# Patient Record
Sex: Male | Born: 1935 | Race: White | Hispanic: No | Marital: Married | Smoking: Former smoker
Health system: Southern US, Community
[De-identification: ages and names within clinical notes are randomized; demographics above are authoritative.]

## PROBLEM LIST (undated history)

## (undated) DIAGNOSIS — C189 Malignant neoplasm of colon, unspecified: Secondary | ICD-10-CM

## (undated) DIAGNOSIS — N390 Urinary tract infection, site not specified: Secondary | ICD-10-CM

## (undated) DIAGNOSIS — H9190 Unspecified hearing loss, unspecified ear: Secondary | ICD-10-CM

## (undated) HISTORY — PX: COLON SURGERY: SHX602

## (undated) HISTORY — PX: HAND SURGERY: SHX662

---

## 2011-06-08 DIAGNOSIS — C189 Malignant neoplasm of colon, unspecified: Secondary | ICD-10-CM

## 2011-06-08 HISTORY — DX: Malignant neoplasm of colon, unspecified: C18.9

## 2016-01-16 ENCOUNTER — Emergency Department: Admit: 2016-01-16 | Payer: MEDICARE | Primary: Family Medicine

## 2016-01-16 ENCOUNTER — Inpatient Hospital Stay
Admit: 2016-01-16 | Discharge: 2016-01-20 | Disposition: A | Discharge status: Home / Self Care | Payer: MEDICARE | Attending: Internal Medicine | Admitting: Internal Medicine

## 2016-01-16 DIAGNOSIS — A419 Sepsis, unspecified organism: Secondary | ICD-10-CM

## 2016-01-16 LAB — CBC WITH AUTOMATED DIFF
ABS. BASOPHILS: 0 10*3/uL (ref 0.0–0.1)
ABS. EOSINOPHILS: 0 10*3/uL (ref 0.0–0.4)
ABS. LYMPHOCYTES: 0.3 10*3/uL — ABNORMAL LOW (ref 0.8–3.5)
ABS. MONOCYTES: 0.9 10*3/uL (ref 0.0–1.0)
ABS. NEUTROPHILS: 13.3 10*3/uL — ABNORMAL HIGH (ref 1.8–8.0)
BASOPHILS: 0 % (ref 0–1)
EOSINOPHILS: 0 % (ref 0–7)
HCT: 39.7 % (ref 36.6–50.3)
HGB: 13.6 g/dL (ref 12.1–17.0)
LYMPHOCYTES: 2 % — ABNORMAL LOW (ref 12–49)
MCH: 32 PG (ref 26.0–34.0)
MCHC: 34.3 g/dL (ref 30.0–36.5)
MCV: 93.4 FL (ref 80.0–99.0)
MONOCYTES: 6 % (ref 5–13)
NEUTROPHILS: 92 % — ABNORMAL HIGH (ref 32–75)
PLATELET: 252 10*3/uL (ref 150–400)
RBC: 4.25 M/uL (ref 4.10–5.70)
RDW: 14.8 % — ABNORMAL HIGH (ref 11.5–14.5)
WBC: 14.5 10*3/uL — ABNORMAL HIGH (ref 4.1–11.1)

## 2016-01-16 LAB — URINALYSIS W/ REFLEX CULTURE
Bacteria: NEGATIVE /hpf
Bilirubin: NEGATIVE
Glucose: NEGATIVE mg/dL
Ketone: 15 mg/dL — AB
Nitrites: NEGATIVE
Protein: NEGATIVE mg/dL
Specific gravity: 1.017 (ref 1.003–1.030)
Urobilinogen: 1 EU/dL (ref 0.2–1.0)
pH (UA): 7 (ref 5.0–8.0)

## 2016-01-16 LAB — METABOLIC PANEL, COMPREHENSIVE
A-G Ratio: 0.9 — ABNORMAL LOW (ref 1.1–2.2)
ALT (SGPT): 13 U/L (ref 12–78)
AST (SGOT): 18 U/L (ref 15–37)
Albumin: 3.3 g/dL — ABNORMAL LOW (ref 3.5–5.0)
Alk. phosphatase: 55 U/L (ref 45–117)
Anion gap: 9 mmol/L (ref 5–15)
BUN/Creatinine ratio: 14 (ref 12–20)
BUN: 13 MG/DL (ref 6–20)
Bilirubin, total: 1.4 MG/DL — ABNORMAL HIGH (ref 0.2–1.0)
CO2: 25 mmol/L (ref 21–32)
Calcium: 9 MG/DL (ref 8.5–10.1)
Chloride: 102 mmol/L (ref 97–108)
Creatinine: 0.93 MG/DL (ref 0.70–1.30)
GFR est AA: >60 mL/min/{1.73_m2} (ref >60)
GFR est non-AA: >60 mL/min/{1.73_m2} (ref >60)
Globulin: 3.7 g/dL (ref 2.0–4.0)
Glucose: 100 mg/dL (ref 65–100)
Potassium: 4.2 mmol/L (ref 3.5–5.1)
Protein, total: 7 g/dL (ref 6.4–8.2)
Sodium: 136 mmol/L (ref 136–145)

## 2016-01-16 LAB — EKG, 12 LEAD, INITIAL
Atrial Rate: 102 {beats}/min
Calculated P Axis: 56 degrees
Calculated R Axis: 52 degrees
Calculated T Axis: 28 degrees
P-R Interval: 196 ms
Q-T Interval: 324 ms
QRS Duration: 70 ms
QTC Calculation (Bezet): 422 ms
Ventricular Rate: 102 {beats}/min

## 2016-01-16 LAB — LACTIC ACID: Lactic acid: 1.3 MMOL/L (ref 0.4–2.0)

## 2016-01-16 MED ORDER — SODIUM CHLORIDE 0.9 % IJ SYRG
INTRAMUSCULAR | Status: DC | PRN
Start: 2016-01-16 — End: 2016-01-20

## 2016-01-16 MED ORDER — ENOXAPARIN 40 MG/0.4 ML SUB-Q SYRINGE
40 mg/0.4 mL | SUBCUTANEOUS | Status: DC
Start: 2016-01-16 — End: 2016-01-20
  Administered 2016-01-17 – 2016-01-20 (×4): via SUBCUTANEOUS

## 2016-01-16 MED ORDER — ACETAMINOPHEN 325 MG TABLET
325 mg | ORAL | Status: DC | PRN
Start: 2016-01-16 — End: 2016-01-20
  Administered 2016-01-16 – 2016-01-19 (×3): via ORAL

## 2016-01-16 MED ORDER — ONDANSETRON (PF) 4 MG/2 ML INJECTION
4 mg/2 mL | INTRAMUSCULAR | Status: DC | PRN
Start: 2016-01-16 — End: 2016-01-20

## 2016-01-16 MED ORDER — SODIUM CHLORIDE 0.9% BOLUS IV
0.9 % | Freq: Once | INTRAVENOUS | Status: AC
Start: 2016-01-16 — End: 2016-01-16
  Administered 2016-01-16: 17:00:00 via INTRAVENOUS

## 2016-01-16 MED ORDER — SODIUM CHLORIDE 0.9 % IV
INTRAVENOUS | Status: DC
Start: 2016-01-16 — End: 2016-01-19
  Administered 2016-01-16 – 2016-01-17 (×3): via INTRAVENOUS

## 2016-01-16 MED ORDER — LEVOFLOXACIN IN D5W 750 MG/150 ML IV PIGGY BACK
750 mg/150 mL | INTRAVENOUS | Status: AC
Start: 2016-01-16 — End: 2016-01-16
  Administered 2016-01-16: 19:00:00 via INTRAVENOUS

## 2016-01-16 MED ORDER — SODIUM CHLORIDE 0.9 % IV PIGGY BACK
1 gram | INTRAVENOUS | Status: DC
Start: 2016-01-16 — End: 2016-01-18
  Administered 2016-01-16 – 2016-01-17 (×2): via INTRAVENOUS

## 2016-01-16 MED ORDER — SODIUM CHLORIDE 0.9 % IJ SYRG
Freq: Three times a day (TID) | INTRAMUSCULAR | Status: DC
Start: 2016-01-16 — End: 2016-01-20
  Administered 2016-01-16 – 2016-01-20 (×12): via INTRAVENOUS

## 2016-01-16 MED ORDER — PIPERACILLIN-TAZOBACTAM 3.375 GRAM IV SOLR
3.375 gram | INTRAVENOUS | Status: AC
Start: 2016-01-16 — End: 2016-01-16
  Administered 2016-01-16: 17:00:00 via INTRAVENOUS

## 2016-01-16 MED FILL — ACETAMINOPHEN 325 MG TABLET: 325 mg | ORAL | Qty: 2

## 2016-01-16 MED FILL — ZOSYN 3.375 GRAM INTRAVENOUS SOLUTION: 3.375 gram | INTRAVENOUS | Qty: 3.38

## 2016-01-16 MED FILL — SODIUM CHLORIDE 0.9 % IV: INTRAVENOUS | Qty: 2500

## 2016-01-16 MED FILL — LEVOFLOXACIN IN D5W 750 MG/150 ML IV PIGGY BACK: 750 mg/150 mL | INTRAVENOUS | Qty: 150

## 2016-01-16 MED FILL — SODIUM CHLORIDE 0.9 % IV: INTRAVENOUS | Qty: 1000

## 2016-01-16 MED FILL — CEFTRIAXONE 1 GRAM SOLUTION FOR INJECTION: 1 gram | INTRAMUSCULAR | Qty: 1

## 2016-01-16 MED FILL — BD POSIFLUSH NORMAL SALINE 0.9 % INJECTION SYRINGE: INTRAMUSCULAR | Qty: 10

## 2016-01-16 NOTE — ED Provider Notes (Signed)
HPI Comments: 82 y.o. male with past medical history significant for colon cancer and s/p colostomy who presents from home with chief complaint of fever. Pt's daughter reports that pt woke up this morning with shaking chills, at which point she took his temperature and got 100 F. She tried to get him to eat and drink water but he refused, and she later took his temperature again and it had raised to 102 F. Pt's daughter reports that he was able to void apparently clear urine, but his output seemed decreased. Pt denies any pain, but reports feeling nauseous and lacking an appetite. Pt had the urge to urinate earlier, but was unable to. Pt has no hx of UTI or pneumonia. Daughter notes that the pt has some level of abdominal distention at baseline and this is not acutely changed. 10 days ago, patient had hand surgery and was on Abx until 3 days ago. Pt had a follow up appointment yesterday and surgical wound was cleansed and the doctor said it was fine. Pt was complaint free and at baseline yesterday according to family. Associated sx include cough. There are no other acute medical concerns at this time.    Note written by Delman Cheadle, Scribe, as dictated by Christella Noa, MD 1:16 PM          The history is provided by the patient and a relative (daughter). No language interpreter was used.        No past medical history on file.    No past surgical history on file.      No family history on file.    Social History     Social History   ??? Marital status: N/A     Spouse name: N/A   ??? Number of children: N/A   ??? Years of education: N/A     Occupational History   ??? Not on file.     Social History Main Topics   ??? Smoking status: Not on file   ??? Smokeless tobacco: Not on file   ??? Alcohol use Not on file   ??? Drug use: Not on file   ??? Sexual activity: Not on file     Other Topics Concern   ??? Not on file     Social History Narrative         ALLERGIES: Review of patient's allergies indicates not on file.     Review of Systems   Constitutional: Positive for appetite change, chills and fever.   Respiratory: Positive for cough. Negative for shortness of breath.    Cardiovascular: Negative for chest pain and leg swelling.   Gastrointestinal: Positive for abdominal distention and nausea. Negative for abdominal pain and constipation.   Genitourinary: Positive for decreased urine volume and difficulty urinating. Negative for dysuria.   Skin: Negative for rash.   All other systems reviewed and are negative.      Vitals:    01/16/16 1259   BP: 143/89   Pulse: (!) 104   Resp: 18   Temp: (!) 100.5 ??F (38.1 ??C)   SpO2: 98%   Weight: 81.6 kg (180 lb)   Height:  (1.753 m)            Physical Exam   Constitutional: He is oriented to person, place, and time. He appears well-developed and well-nourished. No distress.   Elderly, but alert and oriented x4.  Currently no shaking chills. Febrile to 105 F.    HENT:   Head:  Normocephalic and atraumatic.   Eyes: Conjunctivae are normal. No scleral icterus.   Neck: Neck supple. No tracheal deviation present.   Cardiovascular: Normal rate, regular rhythm, normal heart sounds and intact distal pulses.  Exam reveals no gallop and no friction rub.    No murmur heard.  Pulmonary/Chest: Effort normal and breath sounds normal. He has no wheezes. He has no rales.   Abdominal: Soft. He exhibits distension. There is no tenderness. There is no rebound and no guarding.   Colostomy. Abdomen distended but non-tender.   Musculoskeletal: He exhibits no edema.   Surgical dressing on right hand wound.    Neurological: He is alert and oriented to person, place, and time.   Skin: Skin is warm and dry. No rash noted.   Psychiatric: He has a normal mood and affect.   Nursing note and vitals reviewed.  Note written by Delman CheadleAlexander P. Martin, Scribe, as dictated by Christella Noaharles D Yolanda Huffstetler, MD 1:34 PM      MDM  Number of Diagnoses or Management Options  Sepsis, due to unspecified organism [facility](HCC):    Urinary tract infection without hematuria, site unspecified:      Amount and/or Complexity of Data Reviewed  Clinical lab tests: ordered and reviewed  Tests in the radiology section of CPT??: ordered and reviewed  Tests in the medicine section of CPT??: ordered and reviewed  Discussion of test results with the performing providers: yes  Obtain history from someone other than the patient: yes  Discuss the patient with other providers: yes      ED Course       Procedures      ED EKG interpretation:  Rhythm: sinus tachycardia; and regular . Rate (approx.): 102; Axis: normal; ST/T wave: normal.  Note written by Delman CheadleAlexander P. Martin, Scribe, as dictated by Christella Noaharles D Tyqwan Pink, MD 1:35 PM    PROGRESS NOTE:  1:57 PM  Pt has early sepsis, probably UTI source. Will admit to hospitalist service for further evaluation.      CONSULT NOTE:  2:26 PM Christella Noaharles D Dandre Sisler, MD spoke with Dr. Selena BattenKim, Consult for Hospitalist.  Discussed available diagnostic tests and clinical findings. Dr. Selena BattenKim will admit the pt.

## 2016-01-16 NOTE — ED Triage Notes (Signed)
Pt arrived via EMS with complaints of fever and decreased output. Pt also c/o nausea and generalized weakness.

## 2016-01-16 NOTE — ED Notes (Signed)
TRANSFER - OUT REPORT:    Verbal report given to Josie,RN(name) on Joshua ReedyJohn Callahan  being transferred to 5th floor, Rm 521(unit) for routine progression of care       Report consisted of patient???s Situation, Background, Assessment and   Recommendations(SBAR).     Information from the following report(s) SBAR, Kardex, ED Summary, Intake/Output, MAR and Recent Results was reviewed with the receiving nurse.    Lines:   Peripheral IV 01/16/16 Left Antecubital (Active)   Site Assessment Clean, dry, & intact 01/16/2016  1:03 PM   Phlebitis Assessment 0 01/16/2016  1:03 PM   Infiltration Assessment 0 01/16/2016  1:03 PM   Dressing Status Clean, dry, & intact 01/16/2016  1:03 PM   Dressing Type Transparent 01/16/2016  1:03 PM   Hub Color/Line Status Pink;Green 01/16/2016  1:03 PM   Alcohol Cap Used Yes 01/16/2016  1:03 PM       Peripheral IV 01/16/16 Left Arm (Active)   Site Assessment Clean, dry, & intact 01/16/2016  1:07 PM   Phlebitis Assessment 0 01/16/2016  1:07 PM   Infiltration Assessment 0 01/16/2016  1:07 PM   Dressing Status Clean, dry, & intact 01/16/2016  1:07 PM   Dressing Type Transparent 01/16/2016  1:07 PM   Alcohol Cap Used Yes 01/16/2016  1:07 PM        Opportunity for questions and clarification was provided.      Patient transported with:   [employer]

## 2016-01-16 NOTE — ED Notes (Signed)
Tylenol given PO for c/o fever/aches.

## 2016-01-16 NOTE — Progress Notes (Signed)
[facility]: MED RECONCILIATION    Comments/Recommendations:   ?? Verified allergies as documented: NKA  ?? Med rec completed with daughter and son-in-law present in the room  ?? They report that he recently had hand surgery and was prescribed antibiotics (cephalexin 500 mg every 6 hours for 5 days) and Tylenol #3.  ?? She did not want to wake him up for the fourth (night-time) dose of the cephalexin, so she just gave it to him TID at 9am, 3pm, and 9pm.  ?? Otherwise on a routine basis he only takes bumex, which they could not recall when he took last, and [name] said he had not taken it in a long time.    Medications added:     ?? All - no medication profile on file previously.    Information obtained from: Patient, family, Pharmacy ([facility]), medication bottle for Tylenol # 3    Medication reconciliation completed with patient???s own medications at the patient???s bedside  ?? Counseled the patient to:  ?? Not use their own medication while in the hospital unless otherwise instructed  ?? Have a family member take the medication home as soon as possible - family will take home bottle of Tylenol #3  ?? If medication cannot be taken home, request that the patient notify the nurse so the medication may be locked up according to hospital policy      Allergies: Review of patient's allergies indicates no known allergies.    Prior to Admission Medications:   Prior to Admission Medications   Prescriptions Last Dose Informant Patient Reported? Taking?   acetaminophen-codeine (TYLENOL-CODEINE #3) 300-30 mg per tablet 01/15/2016 at PM Child Yes Yes   Sig: Take 1 Tab by mouth every four (4) hours as needed for Pain. Post hand surgery   bumetanide (BUMEX) 0.5 mg tablet Cannot recall Child Yes Yes   Sig: Take 0.5 mg by mouth every three (3) days.      Facility-Administered Medications: None       Thank you,  Joshua Callahan, PharmD     Contact: [phone]

## 2016-01-16 NOTE — ED Notes (Signed)
Code purple called. Dr. Gregor HamsMagnant at bedside.

## 2016-01-16 NOTE — ED Notes (Signed)
Dr. Kim at bedside.

## 2016-01-16 NOTE — ED Notes (Signed)
Attempted to call report to 5th floor Rm 521; unable to receive at this time.

## 2016-01-16 NOTE — ED Notes (Signed)
Pt attempted to urinate in urinal, unsuccessful.  Per Dr. Shelbie ProctorMagnant-straight cath pt for specimen collection.

## 2016-01-16 NOTE — H&P (Signed)
Admission History and Physical      NAME:  Joshua Callahan  DOB:   [birth year]   MRN:  [id number]     PCP:  Gustavo Lah, MD     Date/Time:  01/16/2016           Assessment/Plan:       Sepsis (HCC) (01/16/2016):  With fever, tachycardia, and leukocytosis.  Not severe sepsis or septic shock.  Blood cultures drawn in ED.   -- Continue IVF   -- tylenol PRN    UTI:  Suspected based upon hx of fevers and abnormal UA with pyuria and LE.  Given levaquin x 1 in ED.   -- Start ceftriaxone IV   -- await urine culture    Edema ():  Chronic leg edema.  Daughter reports it is better today compared to usual.   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  Surgical wounds clean.  No erythema or drainage to suspect infection.   -- follow up with surgeon next week              Subjective:     CHIEF COMPLAINT:  Fever.    HISTORY OF PRESENT ILLNESS:     Joshua Callahan is a 82 y.o.  Caucasian male who is admitted with Sepsis (HCC).  Joshua Callahan presented to the Emergency Department today complaining of fever.  Hx obtained from patient and daughter.  Daughter reports patient with fever and shaking chills today.  Fever to 102 at home.  Had surgery of left hand on 8/1.  Seen in follow up by surgeon yesterday and had two pins removed and wound cleaned.  Surgeon felt wound was healing appropriately.  Patient denies subjective fever.  Denies headache, sob, cough, abdominal pains.  Denies diarrhea.  Denies dysuria.  No sick contacts.  Had nausea earlier today, but no vomiting.      Past Medical History:   Diagnosis Date   ??? Colon cancer (HCC)    ??? Edema         Past Surgical History:   Procedure Laterality Date   ??? HX COLECTOMY         Social History   Substance Use Topics   ??? Smoking status: Never Smoker   ??? Smokeless tobacco: Not on file   ??? Alcohol use No        Family History   Problem Relation Age of Onset   ??? Heart Disease Daughter         No Known Allergies     Prior to Admission medications     Medication Sig Start Date End Date Taking? Authorizing Provider   bumetanide (BUMEX) 0.5 mg tablet Take 0.5 mg by mouth every three (3) days.   Yes Historical Provider   acetaminophen-codeine (TYLENOL-CODEINE #3) 300-30 mg per tablet Take 1 Tab by mouth every four (4) hours as needed for Pain. Post hand surgery   Yes Historical Provider         Review of Systems:  (bold if positive, if negative)    Gen:  Eyes:  ENT:  CVS:  edemaPulm:  GI:    GU:    MS:  swellingSkin:  Endo:    Hem:  Renal:    Neuro:            Objective:      VITALS:    Vital signs reviewed; most recent are:    Visit Vitals   ???  BP 155/72   ??? Pulse (!) 106   ??? Temp (!) 100.5 ??F (38.1 ??C)   ??? Resp 18   ??? Ht 5\' 9"  (1.753 m)   ??? Wt 81.6 kg (180 lb)   ??? SpO2 98%   ??? BMI 26.58 kg/m2     SpO2 Readings from Last 6 Encounters:   01/16/16 98%          Intake/Output Summary (Last 24 hours) at 01/16/16 1509  Last data filed at 01/16/16 1325   Gross per 24 hour   Intake              448 ml   Output                0 ml   Net              448 ml            Exam:     Physical Exam:    Gen:  Well-developed, well-nourished, in no acute distress  HEENT:  Pink conjunctivae, PERRL, hearing decreased to voice, moist mucous membranes  Neck:  Supple  Resp:  No accessory muscle use, clear breath sounds without wheezes rales or rhonchi  Card:  No murmurs, normal S1, S2 without thrills, 1+ LUE peripheral edema  Abd:  Soft, non-tender, non-distended, normoactive bowel sounds are present  Musc:  No cyanosis  Skin:  No rashes or ulcers, left hand sutures in place w/o erythema or drainage  Neuro:  Cranial nerves 3-12 are grossly intact, lifts arms off bed with 5/5 bilaterally, dorsi / plantarflexion strength is 5/5 bilaterally, follows commands appropriately  Psych:  Alert with good insight.  Oriented to person, place, and time       Labs:    Recent Labs      01/16/16   1312   WBC  14.5*   HGB  13.6   HCT  39.7   PLT  252     Recent Labs      01/16/16   1312   NA  136   K  4.2    CL  102   CO2  25   GLU  100   BUN  13   CREA  0.93   CA  9.0   ALB  3.3*   TBILI  1.4*   SGOT  18   ALT  13     No results found for: GLUCPOC  No results for input(s): PH, PCO2, PO2, HCO3, FIO2 in the last 72 hours.  No results for input(s): INR in the last 72 hours.    No lab exists for component: INREXT    Chest Xray:  Atelectasis.  EKG reviewed:   Sinus tachycardia w/o acute ischemic st/t wave changes.    Surrogate decision maker:  daughter    Total time spent with patient: 5960 Minutes                  Care Plan discussed with: Patient and Family    Discussed:  Care Plan    Prophylaxis:  Lovenox    Probable Disposition:  Home w/Family           ___________________________________________________    Attending Physician: Suzan Garibaldihomas J Sydelle Sherfield, MD

## 2016-01-16 NOTE — Progress Notes (Signed)
TRANSFER - IN REPORT:    Verbal report received from   April, Rn(name) on Joshua Callahan  being received from  Emergency room(unit) for routine progression of care      Report consisted of patient???s Situation, Background, Assessment and   Recommendations(SBAR).     Information from the following report(s) SBAR, Kardex, Intake/Output, MAR, Accordion, Recent Results and Med Rec Status was reviewed with the receiving nurse.    Opportunity for questions and clarification was provided.      Assessment completed upon patient???s arrival to unit and care assumed.

## 2016-01-16 NOTE — Progress Notes (Signed)
Will await UA prior to decision to admit.  If UA negative, would need to consider alternative sources of infection I.e. Hand.

## 2016-01-16 NOTE — Progress Notes (Signed)
Primary Nurse Josie Pierre-Louis, RN and  IFE, RN performed a dual skin assessment on this patient No impairment noted except  Penis and scrotum redness lotion applied and left hand surgery with cast intact and elevate on pillows  Braden score is 18

## 2016-01-16 NOTE — Progress Notes (Signed)
Bedside and Verbal shift change report given to  Ife, Rn (oncoming nurse) by   Josie, Rn (offgoing nurse). Report included the following information SBAR, Kardex, Intake/Output, MAR, Accordion, Recent Results and Med Rec Status.

## 2016-01-17 LAB — CBC W/O DIFF
HCT: 34.8 % — ABNORMAL LOW (ref 36.6–50.3)
HGB: 11.1 g/dL — ABNORMAL LOW (ref 12.1–17.0)
MCH: 30.3 PG (ref 26.0–34.0)
MCHC: 31.9 g/dL (ref 30.0–36.5)
MCV: 95.1 FL (ref 80.0–99.0)
PLATELET: 183 10*3/uL (ref 150–400)
RBC: 3.66 M/uL — ABNORMAL LOW (ref 4.10–5.70)
RDW: 15.1 % — ABNORMAL HIGH (ref 11.5–14.5)
WBC: 12.3 10*3/uL — ABNORMAL HIGH (ref 4.1–11.1)

## 2016-01-17 LAB — METABOLIC PANEL, BASIC
Anion gap: 6 mmol/L (ref 5–15)
BUN/Creatinine ratio: 14 (ref 12–20)
BUN: 13 MG/DL (ref 6–20)
CO2: 26 mmol/L (ref 21–32)
Calcium: 8.7 MG/DL (ref 8.5–10.1)
Chloride: 106 mmol/L (ref 97–108)
Creatinine: 0.94 MG/DL (ref 0.70–1.30)
GFR est AA: >60 mL/min/{1.73_m2} (ref >60)
GFR est non-AA: >60 mL/min/{1.73_m2} (ref >60)
Glucose: 94 mg/dL (ref 65–100)
Potassium: 3.8 mmol/L (ref 3.5–5.1)
Sodium: 138 mmol/L (ref 136–145)

## 2016-01-17 LAB — TROPONIN I: Troponin-I, Qt.: <0.04 ng/mL (ref <0.05)

## 2016-01-17 LAB — NT-PRO BNP: NT pro-BNP: 1675 PG/ML — ABNORMAL HIGH (ref 0–450)

## 2016-01-17 LAB — CK W/ REFLX CKMB: CK: 63 U/L (ref 39–308)

## 2016-01-17 MED FILL — LOVENOX 40 MG/0.4 ML SUBCUTANEOUS SYRINGE: 40 mg/0.4 mL | SUBCUTANEOUS | Qty: 0.4

## 2016-01-17 MED FILL — CEFTRIAXONE 1 GRAM SOLUTION FOR INJECTION: 1 gram | INTRAMUSCULAR | Qty: 1

## 2016-01-17 NOTE — Progress Notes (Signed)
[facility]  [address], [place], [place] [id number]  [phone]      Medical Progress Note      NAME:         Joshua ReedyJohn Callahan   DOB:        May 29, 1918  MRM:        [id number]    Date:          01/17/2016      Subjective: Patient has been seen and examined as a follow up for a UTI. Chart, labs, diagnostics reviewed. He feels well. Denies any nausea or vomiting. Tolerating diet well. No fever or chills.     Objective:    Vital Signs:    Visit Vitals   ??? BP 110/56 (BP 1 Location: Right arm, BP Patient Position: At rest)   ??? Pulse 79   ??? Temp 98.3 ??F (36.8 ??C)   ??? Resp 18   ??? Ht 5\' 9"  (1.753 m)   ??? Wt 81.6 kg (180 lb)   ??? SpO2 93%   ??? BMI 26.58 kg/m2        Intake/Output Summary (Last 24 hours) at 01/17/16 1100  Last data filed at 01/16/16 2357   Gross per 24 hour   Intake              448 ml   Output              200 ml   Net              248 ml        Physical Examination:    General:   Well looking and nourished patient in no acute distress  Eyes:   pink conjunctivae, PERRLA with no discharge.  ENT:   no ottorrhea or rhinorrhea with moist mucous membranes  Neck: no masses, thyroid non-tender and trachea central.  Pulm: clear breath sounds without crackles or wheezes  Card:  no JVD or murmurs, has regular and normal S1, S2 without thrills, bruits. + peripheral edema  Abd:  Soft, non-tender, non-distended, normoactive bowel sounds with no palpable organomegaly  Musc:  No cyanosis, clubbing, atrophy or deformities. Dressed and splinted left hand  Skin:  No rashes, bruising or ulcers.   Neuro: Awake and alert. Generally a non focal exam. Follows commands appropriately  Psych:  Has a fair insight and is oriented x 3    Current Facility-Administered Medications   Medication Dose Route Frequency   ??? sodium chloride (NS) flush 5-10 mL  5-10 mL IntraVENous PRN   ??? cefTRIAXone (ROCEPHIN) 1 g in 0.9% sodium chloride (MBP/ADV) 50 mL  1 g  IntraVENous Q24H   ??? sodium chloride (NS) flush 5-10 mL  5-10 mL IntraVENous Q8H   ??? sodium chloride (NS) flush 5-10 mL  5-10 mL IntraVENous PRN   ??? 0.9% sodium chloride infusion  75 mL/hr IntraVENous CONTINUOUS   ??? acetaminophen (TYLENOL) tablet 650 mg  650 mg Oral Q4H PRN   ??? ondansetron (ZOFRAN) injection 4 mg  4 mg IntraVENous Q4H PRN   ??? enoxaparin (LOVENOX) injection 40 mg  40 mg SubCUTAneous Q24H        Laboratory data and review:    Recent Labs      01/17/16   0605  01/16/16   1312   WBC  12.3*  14.5*   HGB  11.1*  13.6   HCT  34.8*  39.7   PLT  183  252  Recent Labs      01/17/16   0605  01/16/16   1312   NA  138  136   K  3.8  4.2   CL  106  102   CO2  26  25   GLU  94  100   BUN  13  13   CREA  0.94  0.93   CA  8.7  9.0   ALB   --   3.3*   SGOT   --   18   ALT   --   13     No components found for: GLPOC    Assessment and Plan:    Sepsis due to a UTI (01/16/2016): NOT severe sepsis. Cultures neg thus far. Decrease IVFs rate. Continue IV antibiotic and follow clinically.     UTI:  Suspected based upon hx of fevers and abnormal UA with pyuria and LE. Cultures pending. Continue IV Ceftriaxone IV and follow urine cultures.     Edema:  Chronic leg edema. Stable. I agree to hold bumex for now  ??  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  Surgical wounds clean.  No erythema or drainage to suspect infection. Out patient follow up with surgeon next week     Total time spent for the patient's care: 35 Minutes                  Care Plan discussed with: Patient and Nursing Staff    Discussed:  Care Plan and D/C Planning    Prophylaxis:  Lovenox    Anticipated Disposition:  Home w/Family           ___________________________________________________    Attending Physician:   Melynda Keller, MD

## 2016-01-17 NOTE — Progress Notes (Signed)
01/17/2016  12:04 PM  CM met w/ Pt and DTR Paulette for needs assessment.  Charted demographics verified, lives w/ DTR Paulette and SIL in 2 story home w/ bed/bath on GF and 1 entry step.  PTA PT was independent w/ ADL's, however since hand surgery DTR has been providing assist w/ ADL's.  PCPis Inetta Fermo, MD; Rx fills long term [facility] or [facility] [address]. Pt uses no DME, has not had recent HH.  Family will transport home and to all F/U.  State and [place] letters given, explained and signed, copies to hard chart.   Care Management Interventions  PCP Verified by CM: Yes (PCP is Inetta Fermo, MD; last visit 1 month ago)  Palliative Care Consult (Criteria: CHF and RRAT>21): No  Reason for No Palliative Care Consult: Other (see comment) (Pt RRAT is 5)  Mode of Transport at Discharge: Other (see comment) (DTR Paulette (C) [phone])  Current Support Network: Other (Pt lives w/ DTR Paulette and son-in-law)  Confirm Follow Up Transport: Family  Discharge Location  Discharge Placement: Home  Plan is to D/C to home when stable, CM to follow for needs and assist.  Alinda Dooms

## 2016-01-17 NOTE — Other (Signed)
Bedside and Verbal shift change report given to  Ei Ei, RN (oncoming nurse) by Ify, RN (offgoing nurse). Report included the following information SBAR, Kardex, Intake/Output, MAR, Accordion, Recent Results and Med Rec Status.

## 2016-01-17 NOTE — Progress Notes (Signed)
Bedside and Verbal shift change report given to Rolene ArbourIfy Elebo, RN (oncoming nurse) by Joetta MannersEi Ei Phyu, RN (offgoing nurse). Report included the following information SBAR, Kardex, Intake/Output, MAR and Recent Results.

## 2016-01-18 LAB — CULTURE, URINE: Colony Count: 4000

## 2016-01-18 MED ORDER — CEFEPIME 2 GRAM SOLUTION FOR INJECTION
2 gram | INTRAMUSCULAR | Status: DC
Start: 2016-01-18 — End: 2016-01-20
  Administered 2016-01-18 – 2016-01-20 (×3): via INTRAVENOUS

## 2016-01-18 MED FILL — ACETAMINOPHEN 325 MG TABLET: 325 mg | ORAL | Qty: 2

## 2016-01-18 MED FILL — CEFEPIME 2 GRAM SOLUTION FOR INJECTION: 2 gram | INTRAMUSCULAR | Qty: 2

## 2016-01-18 MED FILL — LOVENOX 40 MG/0.4 ML SUBCUTANEOUS SYRINGE: 40 mg/0.4 mL | SUBCUTANEOUS | Qty: 0.4

## 2016-01-18 NOTE — Progress Notes (Signed)
Occupational Therapy EVALUATION/discharge  Patient: Joshua Callahan (82 y.o. male)  Date: 01/18/2016  Primary Diagnosis: Sepsis [facility])        Precautions: Fall    ASSESSMENT:   Based on the objective data described below, the patient presents at an overall mod I to supervision level with functional mobility and min A for ADLs after admission for sepsis.  Pt has sx on his L hand 01/06/2016 for Dupuytren Contractures and remains in a splint which limits his independence with ADLs.  He lives with his daughter and she has been assist him with ADLs.  Pt, his daughter and son state pt is currently at his physically baseline aside from recent L hand sx.    Further skilled acute occupational therapy is not indicated at this time.  Discharge Recommendations: Outpatient hand therapy- pt is already attending  Further Equipment Recommendations for Discharge: TBD      SUBJECTIVE:   Patient stated ???I am fine.???    OBJECTIVE DATA SUMMARY:   HISTORY:   Past Medical History:   Diagnosis Date   ??? Colon cancer (HCC)    ??? Edema      Past Surgical History:   Procedure Laterality Date   ??? HX COLECTOMY         Prior Level of Function/Home Situation: Prior to L hand sx 01/06/2016 he was independent with ADLs and amb.  He goes to gym several times a week and is very active  Expanded or extensive additional review of patient history: obtained from pt, his son and daughter and chart    Home Situation  Home Environment: Private residence  # Steps to Enter: 2  Rails to Enter: Yes  Hand Rails : Right  One/Two Story Residence: Two story, live on 1st floor  Living Alone: No  Support Systems: Child(ren), Family member(s)  Patient Expects to be Discharged to:: Private residence  Current DME Used/Available at Home: None  Tub or Shower Type: Shower    Right hand dominant     Left hand dominant    EXAMINATION OF PERFORMANCE DEFICITS:  Cognitive/Behavioral Status:  Neurologic State: Alert  Orientation Level: Oriented X4   Cognition: Appropriate for age attention/concentration;Follows commands  Perception: Appears intact  Perseveration: No perseveration noted  Safety/Judgement: Awareness of environment;Fall prevention;Insight into deficits;Decreased awareness of need for safety    Hearing:  Auditory  Auditory Impairment: Hard of hearing, bilateral    Vision/Perceptual:      Acuity: Able to read clock/calendar on wall without difficulty    Corrective Lenses: Glasses    Range of Motion:  AROM: Generally decreased, functional (except L hand Dupuytren contracture sx 01/06/2016)  PROM: Generally decreased, functional (except L hand Dupuytren contracture sx 01/06/2016)                      Strength:  Strength: Generally decreased, functional (except L hand dupatrens contracture sx 01/06/2016)                Coordination:     Fine Motor Skills-Upper: Left Impaired;Right Intact    Gross Motor Skills-Upper: Left Intact;Right Intact    Balance:  Sitting: Intact  Standing: Intact;Without support    Functional Mobility and Transfers for ADLs:  Bed Mobility:  Rolling: Independent  Supine to Sit: Independent  Sit to Supine: Independent  Scooting: Independent    Transfers:  Sit to Stand: Independent  Stand to Sit: Independent  Bed to Chair: Supervision  Toilet Transfer : Supervision  ADL Assessment:  Feeding: Setup (A to cut food and open containers due to L hand impairments)    Oral Facial Hygiene/Grooming: Setup;Additional time (A to open containers due to L hand impairments)    Bathing: Minimum assistance;Additional time;Assist x1 (A to wash RUE and for cleanliness)    Upper Body Dressing: Minimum assistance;Additional time;Assist x1 (to manage clothing fasteners)    Lower Body Dressing: Minimum assistance;Additional time;Assist x1 (to manage clothing fasteners)    Toileting: Minimum assistance;Additional time;Assist x1 (to manage clothing fasteners)                ADL Intervention and task modifications:   Patient was educated on the benefits of maintaining activity tolerance, functional mobility, and independence with self care tasks during acute stay. Encouraged patient to be out of bed for all meals, perform daily ADLs (as approved by RN/MD regarding bathing etc), performing functional mobility to/from bathroom, and increasing time OOB daily. Patient educated about the importance of maintaining activity tolerance to ensure safe return home and to baseline. Patient verbalized understanding of education.       Cognitive Retraining  Safety/Judgement: Awareness of environment;Fall prevention;Insight into deficits;Decreased awareness of need for safety    Functional Measure:  Barthel Index:    Bathing: 0  Bladder: 10  Bowels: 10  Grooming: 0  Dressing: 5  Feeding: 5  Mobility: 10  Stairs: 5  Toilet Use: 5  Transfer (Bed to Chair and Back): 10  Total: 60       Barthel and G-code impairment scale:  Percentage of impairment CH  0% CI  1-19% CJ  20-39% CK  40-59% CL  60-79% CM  80-99% CN  100%   Barthel Score 0-100 100 99-80 79-60 59-40 20-39 1-19   0   Barthel Score 0-20 20 17-19 13-16 9-12 5-8 1-4 0      The Barthel ADL Index: Guidelines  1. The index should be used as a record of what a patient does, not as a record of what a patient could do.  2. The main aim is to establish degree of independence from any help, physical or verbal, however minor and for whatever reason.  3. The need for supervision renders the patient not independent.  4. A patient's performance should be established using the best available evidence. Asking the patient, friends/relatives and nurses are the usual sources, but direct observation and common sense are also important. However direct testing is not needed.  5. Usually the patient's performance over the preceding 24-48 hours is important, but occasionally longer periods will be relevant.  6. Middle categories imply that the patient supplies over 50 per cent of the effort.   7. Use of aids to be independent is allowed.    Clarisa KindredMahoney, F.l., Barthel, D.W. [phone](1965). Functional evaluation: the Barthel Index. Md State Med J (14)2.  Zenaida NieceVan der Sulph[place] SpringsPut[place], J.J.M.F, [place], Ian MalkinJ.C., Margret ChanceFreeman, J.A., [place], [place].J. (1999). Measuring the change indisability after inpatient rehabilitation; comparison of the responsiveness of the Barthel Index and Functional Independence Measure. Journal of Neurology, Neurosurgery, and Psychiatry, 66(4), [phone].  Dawson BillsVan Exel, N.J.A, [place],  W.J.M, & Koopmanschap, M.A. (2004.) Assessment of post-stroke quality of life in cost-effectiveness studies: The usefulness of the Barthel Index and the EuroQoL-5D. Quality of Life Research, 13, [phone]       G codes:  In compliance with CMS???s Claims Based Outcome Reporting, the following G-code set was chosen for this patient based on their primary functional limitation being treated:  The outcome measure chosen to determine the severity of the functional limitation was the Barthel Index with a score of 60/100 which was correlated with the impairment scale.    ? Self Care:    [phone] - CURRENT STATUS: CJ - 20%-39% impaired, limited or restricted   [phone] - GOAL STATUS: CJ - 20%-39% impaired, limited or restricted   U9811 - D/C STATUS:  CJ - 20%-39% impaired, limited or restricted       Occupational Therapy Evaluation Charge Determination   History Examination Decision-Making   LOW Complexity : Brief history review  LOW Complexity : 1-3 performance deficits relating to physical, cognitive , or psychosocial skils that result in activity limitations and / or participation restrictions  LOW Complexity : No comorbidities that affect functional and no verbal or physical assistance needed to complete eval tasks       Based on the above components, the patient evaluation is determined to be of the following complexity level: LOW   Pain:Pain Scale 1: Numeric (0 - 10)  Pain Intensity 1: 0              Activity Tolerance:   Fair   Please refer to the flowsheet for vital signs taken during this treatment.  After treatment:     Patient left in no apparent distress sitting up in chair    Patient left in no apparent distress in bed    Call bell left within reach    Nursing notified    Caregiver present    Bed alarm activated    COMMUNICATION/EDUCATION:   Communication/Collaboration:        Home safety education was provided and the patient/caregiver indicated understanding.        Patient/family have participated as able and agree with findings and recommendations.        Patient is unable to participate in plan of care at this time.  Findings and recommendations were discussed with: Physical Therapist and Registered Nurse    Lucretia Field, OT  Time Calculation: 11 mins

## 2016-01-18 NOTE — Progress Notes (Signed)
physical Therapy EVALUATION/DISCHARGE  Patient: Joshua Callahan (82 y.o. male)  Date: 01/18/2016  Primary Diagnosis: Sepsis (HCC)        Precautions: Left UE on a hand splint s/p pin removal     ASSESSMENT :  Based on the objective data described above, the patient presents with independent with ambulation without assistive device and independent with all functional mobility. Patient at his base line level of function. Son in the room during evaluation and agreed patient at his base line and confirmed patient occasionally use single point cane at times.  Reviewed all safety precaution and home exercise program with the patient, verbalized understanding, clear to go home per Physical Therapy perspective.  Skilled physical therapy is not indicated at this time.       PLAN :  Discharge Recommendations: None  Further Equipment Recommendations for Discharge: already has DME's     SUBJECTIVE:   Patient stated ???I feel fine.???    OBJECTIVE DATA SUMMARY:   HISTORY:    Past Medical History:   Diagnosis Date   ??? Colon cancer (HCC)    ??? Edema      Past Surgical History:   Procedure Laterality Date   ??? HX COLECTOMY       Prior Level of Function/Home Situation: Independent community ambulator without assistive device.  Personal factors and/or comorbidities impacting plan of care:          EXAMINATION/PRESENTATION/DECISION MAKING:   Critical Behavior:  Neurologic State: Alert  Orientation Level: Oriented X4  Cognition: Follows commands     Hearing:  Auditory  Auditory Impairment: Hard of hearing, bilateral    Range Of Motion:  AROM: Within functional limits (left UE on a hand splint s/p pin removal)           PROM: Within functional limits           Strength:    Strength: Within functional limits                    Tone & Sensation:                                  Coordination:     Vision:      Functional Mobility:  Bed Mobility:  Rolling: Independent  Supine to Sit: Independent  Sit to Supine: Independent  Scooting: Independent   Transfers:  Sit to Stand: Independent  Stand to Sit: Independent  Stand Pivot Transfers: Supervision     Bed to Chair: Supervision              Balance:   Sitting: Intact  Standing: Intact;Without support  Ambulation/Gait Training:  Distance (ft): 100 Feet (ft)     Ambulation - Level of Assistance: Independent     Gait Description (WDL): Within defined limits                 Speed/Cadence: Pace decreased (<100 feet/min)                       Therapeutic Exercises:    Instructed patient to continue active range of motion exercise on both legs while up on chair or on bed.       Functional Measure:  Tinetti test:    Sitting Balance: 1  Arises: 2  Attempts to Rise: 2  Immediate Standing Balance: 2  Standing Balance: 2  Nudged: 2  Eyes Closed:  1  Turn 360 Degrees - Continuous/Discontinuous: 1  Turn 360 Degrees - Steady/Unsteady: 1  Sitting Down: 2  Balance Score: 16  Indication of Gait: 1  R Step Length/Height: 1  L Step Length/Height: 1  R Foot Clearance: 1  L Foot Clearance: 1  Step Symmetry: 1  Step Continuity: 1  Path: 2  Trunk: 2  Walking Time: 0  Gait Score: 11  Total Score: 27       Tinetti Test and G-code impairment scale:  Percentage of Impairment CH    0%   CI    1-19% CJ    20-39% CK    40-59% CL    60-79% CM    80-99% CN     100%   Tinetti  Score 0-28 28 23-27 17-22 12-16 6-11 1-5 0       Tinetti Tool Score Risk of Falls  <19 = High Fall Risk  19-24 = Moderate Fall Risk  25-28 = Low Fall Risk  Tinetti ME. Performance-Oriented Assessment of Mobility Problems in Elderly Patients. JAGS 1986; [id number]. (Scoring Description: PT Bulletin Feb. 10, 1993)    Older adults: Lonn Georgia et al, 2009; n = 1000 [place] elderly evaluated with ABC, POMA, ADL, and IADL)  ?? Mean POMA score for males aged 65-79 years = 26.21(3.40)  ?? Mean POMA score for females age 44-79 years = 25.16(4.30)  ?? Mean POMA score for males over 80 years = 23.29(6.02)  ?? Mean POMA score for females over 80 years = 17.20(8.32)       G codes:   In compliance with CMS???s Claims Based Outcome Reporting, the following G-code set was chosen for this patient based on their primary functional limitation being treated:    The outcome measure chosen to determine the severity of the functional limitation was the tinetti with a score of 27/28 which was correlated with the impairment scale.    ? Mobility - Walking and Moving Around:    [phone] - CURRENT STATUS: CI - 1%-19% impaired, limited or restricted   G8979 - GOAL STATUS: CI - 1%-19% impaired, limited or restricted   U0454 - D/C STATUS:  CI - 1%-19% impaired, limited or restricted          Physical Therapy Evaluation Charge Determination   History Examination Presentation Decision-Making   LOW Complexity : Zero comorbidities / personal factors that will impact the outcome / POC LOW Complexity : 1-2 Standardized tests and measures addressing body structure, function, activity limitation and / or participation in recreation  LOW Complexity : Stable, uncomplicated  Other outcome measures tinetti  LOW       Based on the above components, the patient evaluation is determined to be of the following complexity level: LOW     Pain:Pain Scale 1: Numeric (0 - 10)  Pain Intensity 1: 0     Activity Tolerance:   Good.  Please refer to the flowsheet for vital signs taken during this treatment.  After treatment:      Patient left in no apparent distress sitting up in chair     Patient left in no apparent distress in bed     Call bell left within reach     Nursing notified     Caregiver present     Bed alarm activated    COMMUNICATION/EDUCATION:   Communication/Collaboration:     Fall prevention education was provided and the patient/caregiver indicated understanding.     Patient/family have participated as able and agree with  findings and recommendations.     Patient is unable to participate in plan of care at this time.  Findings and recommendations were discussed with: Registered Nurse and patient     Thank you for this referral.  Worthy FlankAlberto E Dela Cruz, PT   Time Calculation: 25 mins

## 2016-01-18 NOTE — Progress Notes (Signed)
[facility]  [address] [place], [place] [address]  [phone]      Medical Progress Note      NAME:         Joshua Callahan   DOB:        1918-02-09  MRM:        [id number]    Date:          01/18/2016      Subjective: Patient has been seen and examined as a follow up for a UTI. Chart, labs, diagnostics reviewed. He feels well with no new symptoms. Afebrile. No nausea or vomiting.      Objective:    Vital Signs:    Visit Vitals   ??? BP 150/72 (BP 1 Location: Right arm, BP Patient Position: At rest)   ??? Pulse 82   ??? Temp 98.3 ??F (36.8 ??C)   ??? Resp 18   ??? Ht  (1.753 m)   ??? Wt 81.6 kg (180 lb)   ??? SpO2 93%   ??? BMI 26.58 kg/m2          Intake/Output Summary (Last 24 hours) at 01/18/16 1046  Last data filed at 01/18/16 0752   Gross per 24 hour   Intake                0 ml   Output              400 ml   Net             -400 ml        Physical Examination:    General:   Well looking and nourished patient in no acute distress  Eyes:   pink conjunctivae, PERRLA with no discharge.  ENT:   no ottorrhea or rhinorrhea with moist mucous membranes  Pulm: clear breath sounds without crackles or wheezes  Card:  has regular and normal S1, S2 without thrills, bruits. + peripheral edema  Abd:  Soft, non-tender, non-distended, normoactive bowel sounds with no palpable organomegaly  Musc:  No cyanosis, clubbing, atrophy or deformities. Dressed and splinted left hand  Skin:  No rashes, bruising or ulcers.   Neuro: Awake and alert. Generally a non focal exam.   Psych:  Has a fair insight and is oriented x 3    Current Facility-Administered Medications   Medication Dose Route Frequency   ??? cefepime (MAXIPIME) 2 g in 0.9% sodium chloride (MBP/ADV) 100 mL  2 g IntraVENous Q24H   ??? sodium chloride (NS) flush 5-10 mL  5-10 mL IntraVENous PRN   ??? sodium chloride (NS) flush 5-10 mL  5-10 mL IntraVENous Q8H    ??? sodium chloride (NS) flush 5-10 mL  5-10 mL IntraVENous PRN   ??? acetaminophen (TYLENOL) tablet 650 mg  650 mg Oral Q4H PRN   ??? ondansetron (ZOFRAN) injection 4 mg  4 mg IntraVENous Q4H PRN   ??? enoxaparin (LOVENOX) injection 40 mg  40 mg SubCUTAneous Q24H        Laboratory data and review:    Recent Labs      01/17/16   0605  01/16/16   1312   WBC  12.3*  14.5*   HGB  11.1*  13.6   HCT  34.8*  39.7   PLT  183  252     Recent Labs      01/17/16   0605  01/16/16   1312   NA  138  136   K  3.8  4.2   CL  106  102   CO2  26  25   GLU  94  100   BUN  13  13   CREA  0.94  0.93   CA  8.7  9.0   ALB   --   3.3*   SGOT   --   18   ALT   --   13     No components found for: GLPOC    Assessment and Plan:    Sepsis due to a UTI (01/16/2016): NOT severe sepsis. Cultures isolating Pseudomonal species. Discontinue IV fluids. Change IV antibiotics to IV Cefepime pending final urine cultures. Monitor      UTI:  Suspected based upon hx of fevers and abnormal UA with pyuria and LE. Cultures pending. Continue IV Cefepime as above.      Edema:  Chronic leg edema. Stable. Continue to hold. bumex.  ??  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  Surgical wounds clean.  No erythema or drainage to suspect infection. Out patient follow up with surgeon next week     Total time spent for the patient's care: 25 Minutes                  Care Plan discussed with: Patient, family and Nursing Staff    Discussed:  Care Plan and D/C Planning    Prophylaxis:  Lovenox    Anticipated Disposition:  Home w/Family           ___________________________________________________    Attending Physician:   Melynda Kellerobert O Verlean Allport, MD

## 2016-01-18 NOTE — Other (Signed)
Bedside and Verbal??shift change report given to ??Ron, RN??(oncoming nurse) by Ify, RN??(offgoing nurse). Report included the following information SBAR, Kardex, Intake/Output, MAR, Accordion, Recent Results and Med Rec Status.

## 2016-01-19 ENCOUNTER — Inpatient Hospital Stay: Admit: 2016-01-19 | Payer: MEDICARE | Primary: Family Medicine

## 2016-01-19 LAB — CBC WITH AUTOMATED DIFF
ABS. BASOPHILS: 0 10*3/uL (ref 0.0–0.1)
ABS. EOSINOPHILS: 0.2 10*3/uL (ref 0.0–0.4)
ABS. LYMPHOCYTES: 0.9 10*3/uL (ref 0.8–3.5)
ABS. MONOCYTES: 0.8 10*3/uL (ref 0.0–1.0)
ABS. NEUTROPHILS: 4.6 10*3/uL (ref 1.8–8.0)
BASOPHILS: 0 % (ref 0–1)
EOSINOPHILS: 3 % (ref 0–7)
HCT: 37.2 % (ref 36.6–50.3)
HGB: 12 g/dL — ABNORMAL LOW (ref 12.1–17.0)
LYMPHOCYTES: 13 % (ref 12–49)
MCH: 30.1 PG (ref 26.0–34.0)
MCHC: 32.3 g/dL (ref 30.0–36.5)
MCV: 93.2 FL (ref 80.0–99.0)
MONOCYTES: 12 % (ref 5–13)
NEUTROPHILS: 72 % (ref 32–75)
PLATELET: 202 10*3/uL (ref 150–400)
RBC: 3.99 M/uL — ABNORMAL LOW (ref 4.10–5.70)
RDW: 14.7 % — ABNORMAL HIGH (ref 11.5–14.5)
WBC: 6.4 10*3/uL (ref 4.1–11.1)

## 2016-01-19 LAB — METABOLIC PANEL, BASIC
Anion gap: 6 mmol/L (ref 5–15)
BUN/Creatinine ratio: 13 (ref 12–20)
BUN: 10 MG/DL (ref 6–20)
CO2: 27 mmol/L (ref 21–32)
Calcium: 8.2 MG/DL — ABNORMAL LOW (ref 8.5–10.1)
Chloride: 105 mmol/L (ref 97–108)
Creatinine: 0.76 MG/DL (ref 0.70–1.30)
GFR est AA: >60 mL/min/{1.73_m2} (ref >60)
GFR est non-AA: >60 mL/min/{1.73_m2} (ref >60)
Glucose: 86 mg/dL (ref 65–100)
Potassium: 4 mmol/L (ref 3.5–5.1)
Sodium: 138 mmol/L (ref 136–145)

## 2016-01-19 MED ORDER — NYSTATIN 100,000 UNIT/G TOPICAL POWDER
100000 unit/gram | Freq: Two times a day (BID) | CUTANEOUS | Status: DC
Start: 2016-01-19 — End: 2016-01-20
  Administered 2016-01-19 – 2016-01-20 (×2): via TOPICAL

## 2016-01-19 MED ORDER — MESALAMINE 1,000 MG RECTAL SUPPOSITORY
1000 mg | Freq: Every evening | RECTAL | Status: DC
Start: 2016-01-19 — End: 2016-01-20
  Administered 2016-01-20: 01:00:00 via RECTAL

## 2016-01-19 MED ORDER — METRONIDAZOLE IN SODIUM CHLORIDE (ISO-OSM) 500 MG/100 ML IV PIGGY BACK
500 mg/100 mL | Freq: Three times a day (TID) | INTRAVENOUS | Status: DC
Start: 2016-01-19 — End: 2016-01-20
  Administered 2016-01-19 – 2016-01-20 (×3): via INTRAVENOUS

## 2016-01-19 MED FILL — CANASA 1,000 MG RECTAL SUPPOSITORY: 1000 mg | RECTAL | Qty: 1

## 2016-01-19 MED FILL — CEFEPIME 2 GRAM SOLUTION FOR INJECTION: 2 gram | INTRAMUSCULAR | Qty: 2

## 2016-01-19 MED FILL — ACETAMINOPHEN 325 MG TABLET: 325 mg | ORAL | Qty: 2

## 2016-01-19 MED FILL — ENOXAPARIN 40 MG/0.4 ML SUB-Q SYRINGE: 40 mg/0.4 mL | SUBCUTANEOUS | Qty: 0.4

## 2016-01-19 MED FILL — METRONIDAZOLE IN SODIUM CHLORIDE (ISO-OSM) 500 MG/100 ML IV PIGGY BACK: 500 mg/100 mL | INTRAVENOUS | Qty: 100

## 2016-01-19 MED FILL — NYSTOP 100,000 UNIT/GRAM TOPICAL POWDER: 100000 unit/gram | CUTANEOUS | Qty: 15

## 2016-01-19 NOTE — Progress Notes (Signed)
Bedside and Verbal shift change report given to Marie RN (oncoming nurse) by Melissa Burgio RN (offgoing nurse). Report included the following information SBAR, Kardex, Intake/Output, MAR and Recent Results.

## 2016-01-19 NOTE — Progress Notes (Signed)
Bedside and Verbal shift change report given to Mellisa Burgio, RN (oncoming nurse) by Ei Ei Phyu, RN (offgoing nurse). Report included the following information SBAR, Kardex, Intake/Output, MAR and Recent Results.

## 2016-01-19 NOTE — Progress Notes (Signed)
[facility]  [address] [place], [place] [id number]  [phone]) [phone]      Medical Progress Note      NAME:         Joshua ReedyJohn Callahan   DOB:        [birth year]  MRM:        [id number]    Date:          01/19/2016      Subjective: Patient has been seen and examined as a follow up for fever. Chart, labs, diagnostics reviewed. Joshua Callahan has been progressing well but today he noted a brownish and persistent stooling. He has not had this before. He has a colostomy due to his hx colon cancer. Has had little output despite adequate eating. He has minimal abdominal discomfort. No fever or chills.     Objective:    Vital Signs:    Visit Vitals   ??? BP 160/75 (BP 1 Location: Left arm, BP Patient Position: At rest)   ??? Pulse 77   ??? Temp 97.8 ??F (36.6 ??C)   ??? Resp 20   ??? Ht 5\' 9"  (1.753 m)   ??? Wt 81.6 kg (180 lb)   ??? SpO2 93%   ??? BMI 26.58 kg/m2        Intake/Output Summary (Last 24 hours) at 01/19/16 1052  Last data filed at 01/19/16 0735   Gross per 24 hour   Intake                0 ml   Output              800 ml   Net             -800 ml        Physical Examination:    General:   Weak looking elderly male, not in much acute distress.   Eyes:   pink conjunctivae, PERRLA with no discharge.  ENT:   no ottorrhea or rhinorrhea with moist mucous membranes  Neck: no masses, thyroid non-tender and trachea central.  Pulm:  no accessory muscle use, clear breath sounds without crackles or wheezes  Card:  no JVD or murmurs, has regular and normal S1, S2 without thrills, bruits or peripheral edema  Abd:  Soft, mild distention. Colostomy has clean margins with little output. Minimal output.   Musc:  No cyanosis, clubbing, atrophy or deformities.  Skin:  No rashes, bruising or ulcers.   Neuro: Awake and alert. Decreased hearing. Generally a non focal exam. Follows commands appropriately  Psych:  Has a fair insight and is oriented x 3     Current Facility-Administered Medications   Medication Dose Route Frequency   ??? metroNIDAZOLE (FLAGYL) IVPB premix 500 mg  500 mg IntraVENous Q8H   ??? cefepime (MAXIPIME) 2 g in 0.9% sodium chloride (MBP/ADV) 100 mL  2 g IntraVENous Q24H   ??? sodium chloride (NS) flush 5-10 mL  5-10 mL IntraVENous PRN   ??? sodium chloride (NS) flush 5-10 mL  5-10 mL IntraVENous Q8H   ??? sodium chloride (NS) flush 5-10 mL  5-10 mL IntraVENous PRN   ??? acetaminophen (TYLENOL) tablet 650 mg  650 mg Oral Q4H PRN   ??? ondansetron (ZOFRAN) injection 4 mg  4 mg IntraVENous Q4H PRN   ??? enoxaparin (LOVENOX) injection 40 mg  40 mg SubCUTAneous Q24H        Laboratory data and review:    Recent Labs  01/17/16   0605  01/16/16   1312   WBC  12.3*  14.5*   HGB  11.1*  13.6   HCT  34.8*  39.7   PLT  183  252     Recent Labs      01/17/16   0605  01/16/16   1312   NA  138  136   K  3.8  4.2   CL  106  102   CO2  26  25   GLU  94  100   BUN  13  13   CREA  0.94  0.93   CA  8.7  9.0   ALB   --   3.3*   SGOT   --   18   ALT   --   13     No components found for: GLPOC    Assessment and Plan:    ?Sepsis due to a UTI (01/16/2016): NOT severe sepsis. Cultures isolating <4000 Pseudomonal species. Now has new symptoms. Check labs. Obtain a CT abdomen and pelvis. Consult GI in light of his hx of colon cancer. Continue IV Cefepime and add metronidazole. Monitor    ??  UTI: ??Suspected based upon hx of fevers and abnormal UA with pyuria and LE. Cultures unremarkable. Continue IV antibiotics as above     ??  Edema: ??Chronic leg edema. Stable. Continue to hold. bumex.  ????  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 ??Surgical wounds clean. No erythema or drainage to suspect infection. Out patient follow up with surgeon next week    Total time spent for the patient's care: 35 Minutes                  Care Plan discussed with: Patient, Family, Care Manager and Nursing Staff    Discussed:  Care Plan and D/C Planning    Prophylaxis:  Lovenox     Anticipated Disposition:  Home w/Family           ___________________________________________________    Attending Physician:   Melynda Kellerobert O Vishaal Strollo, MD

## 2016-01-19 NOTE — Consults (Signed)
[place] [facility]  [id number]  [place], [place] [id number]  [phone]                GASTROENTEROLOGY CONSULTATION NOTE        NAME:  Joshua ReedyJohn Callahan   DOB:   [birth year]   MRN:   [id number]       Requesting Physician:    Ricard Dillonobert Omuria MD    PCP: Gustavo LahJames Bahner, MD    Consult Date: 01/19/2016 11:58 AM    Chief Complaint:  Hx of colon cancer. ? Blood in stool     History of Present Illness:  Patient is a 82 y.o.Caucasian male  who is seen in consultation at the request of Dr. Lorette Angmuria for evaluation and treatment of above complaint. He is very hard of hearing. Daughter Mackey Birchwoodaulette is at bedside and is his caregiver. 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 He is also followed by Dr Jacques EarthlySamdani in oncology. Daughter was concerned about rectal discharge which she has not noted previously. This is a creamy mucoid discharge without bleeding. Patient denies abdominal pain and exam is benign.        PMH:  Past Medical History:   Diagnosis Date   ??? Colon cancer (HCC)    ??? Edema        PSH:  Past Surgical History:   Procedure Laterality Date   ??? HX COLECTOMY         Allergies:  No Known Allergies    Home Medications:  Prior to Admission Medications   Prescriptions Last Dose Informant Patient Reported? Taking?   acetaminophen-codeine (TYLENOL-CODEINE #3) 300-30 mg per tablet 01/15/2016 at PM Child Yes Yes   Sig: Take 1 Tab by mouth every four (4) hours as needed for Pain. Post hand surgery   bumetanide (BUMEX) 0.5 mg tablet Cannot recall Child Yes Yes   Sig: Take 0.5 mg by mouth every three (3) days.      Facility-Administered Medications: None       Hospital Medications:  Current Facility-Administered Medications   Medication Dose Route Frequency   ??? metroNIDAZOLE (FLAGYL) IVPB premix 500 mg  500 mg IntraVENous Q8H   ??? cefepime (MAXIPIME) 2 g in 0.9% sodium chloride (MBP/ADV) 100 mL  2 g IntraVENous Q24H    ??? sodium chloride (NS) flush 5-10 mL  5-10 mL IntraVENous PRN   ??? sodium chloride (NS) flush 5-10 mL  5-10 mL IntraVENous Q8H   ??? sodium chloride (NS) flush 5-10 mL  5-10 mL IntraVENous PRN   ??? acetaminophen (TYLENOL) tablet 650 mg  650 mg Oral Q4H PRN   ??? ondansetron (ZOFRAN) injection 4 mg  4 mg IntraVENous Q4H PRN   ??? enoxaparin (LOVENOX) injection 40 mg  40 mg SubCUTAneous Q24H       Social History:  Daughter Paulette at bedside, caregiver.   Social History   Substance Use Topics   ??? Smoking status: Never Smoker   ??? Smokeless tobacco: Not on file   ??? Alcohol use No       Family History:  Family History   Problem Relation Age of Onset   ??? Heart Disease Daughter        Review of Systems:  Reviewed with pt and daughter and + per HPI    Objective:   Patient Vitals for the past 8 hrs:   BP Temp Pulse Resp SpO2   01/19/16  0721 160/75 97.8 ??F (36.6 ??C) 77 20 93 %     08/14 0701 - 08/14 1900  In: -   Out: 200 [Urine:200]  08/12 1901 - 08/14 0700  In: -   Out: 1000 [Urine:1000]    EXAM:   GEN-pleasant, elderly, no acute distress   NEURO-Alert and oriented x 3   HEENT-PERLA, EOMI, nonichteric, HOH   LUNGS-Clear to ausculation bilaterally.   CARD-Rate regular and rhythym, S1 S2.  No murmur.   ABD-Soft , non-tender, non-distended. Bowel sounds normoactive. Ostomy draining liquid brown stool.              RECTUM-  Creamy discharge noted to rectum, no external hemorrhoids noted   EXT-No edema, warm to touch.   PSYCH - Pleasant and cooperative to care.     Data Review     Recent Labs      01/17/16   0605  01/16/16   1312   WBC  12.3*  14.5*   HGB  11.1*  13.6   HCT  34.8*  39.7   PLT  183  252     Recent Labs      01/17/16   0605  01/16/16   1312   NA  138  136   K  3.8  4.2   CL  106  102   CO2  26  25   BUN  13  13   CREA  0.94  0.93   GLU  94  100   CA  8.7  9.0     Recent Labs      01/16/16   1312   SGOT  18   AP  55   TP  7.0   ALB  3.3*   GLOB  3.7     No results for input(s): INR, PTP, APTT in the last 72 hours.     No lab exists for component: INREXT       Problem List:     Patient Active Problem List   Diagnosis Code   ??? Colon cancer (HCC) C18.9   ??? Edema R60.9   ??? Sepsis (HCC) A41.9   ??? Tachycardia R00.0   ??? Fever R50.9   ??? History of colon cancer Z85.038   ??? Pyuria N39.0     Assessment/Plan:   ?? 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 No bleeding seen. Treat with Canasa suppositories. If further issues, please call Dr Nedra HaiLee as patient known to Dr [facility]isk[facility].    Patient seen in collaboration with Dr Tawny Hoppingiego Baltodano.   Signed By: Clyda GreenerMandi M Sahira Cataldi, NP     01/19/2016  11:58 AM

## 2016-01-19 NOTE — Progress Notes (Signed)
Problem: Falls - Risk of  Goal: *Absence of Falls  Document Schmid Fall Risk and appropriate interventions in the flowsheet.   Outcome: Progressing Towards Goal  Fall Risk Interventions:  Mobility Interventions: Bed/chair exit alarm, Patient to call before getting OOB           Medication Interventions: Bed/chair exit alarm, Patient to call before getting OOB     Elimination Interventions: Bed/chair exit alarm, Call light in reach, Patient to call for help with toileting needs, Urinal in reach

## 2016-01-20 MED ORDER — MESALAMINE 1,000 MG RECTAL SUPPOSITORY
1000 mg | Freq: Every evening | RECTAL | 0 refills | Status: DC
Start: 2016-01-20 — End: 2019-12-10

## 2016-01-20 MED FILL — METRO I.V. 500 MG/100 ML INTRAVENOUS PIGGYBACK: 500 mg/100 mL | INTRAVENOUS | Qty: 100

## 2016-01-20 MED FILL — METRONIDAZOLE IN SODIUM CHLORIDE (ISO-OSM) 500 MG/100 ML IV PIGGY BACK: 500 mg/100 mL | INTRAVENOUS | Qty: 100

## 2016-01-20 MED FILL — ENOXAPARIN 40 MG/0.4 ML SUB-Q SYRINGE: 40 mg/0.4 mL | SUBCUTANEOUS | Qty: 0.4

## 2016-01-20 MED FILL — CEFEPIME 2 GRAM SOLUTION FOR INJECTION: 2 gram | INTRAMUSCULAR | Qty: 2

## 2016-01-20 MED FILL — CANASA 1,000 MG RECTAL SUPPOSITORY: 1000 mg | RECTAL | Qty: 1

## 2016-01-20 MED FILL — NYSTOP 100,000 UNIT/GRAM TOPICAL POWDER: 100000 unit/gram | CUTANEOUS | Qty: 15

## 2016-01-20 NOTE — Progress Notes (Signed)
I have reviewed discharge instructions with the patient and daughter.  The patient and daughter verbalized understanding.Discharge medications reviewed with patient and daughter and appropriate educational materials and side effects teaching were provided.

## 2016-01-20 NOTE — Progress Notes (Signed)
[place] [facility]  [address], [place], [place] [id number]  [phone]      Medical Progress Note      NAME: Joshua ReedyJohn Callahan   DOB:  03/22/1918  MRM:  [id number]    Date/Time: 01/20/2016  10:48 AM       Assessment and Plan:   1.  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 Started on mesalamin suppositories for 7 days by GI. CT abdomen and pelvis is unremarkable.     2.  UTI (01/16/2016): Cultures isolating <4000 Pseudomonal species. Continue IV Cefepime  ??  ????  3.  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 Out patient follow up with surgeon next week            Subjective:     Chief Complaint:  Follow up of pt who was admitted UTI. Fells well    ROS:  (bold if positive, if negative)      Tolerating PT  Tolerating Diet        Objective:     Last 24hrs VS reviewed since prior progress note. Most recent are:    Visit Vitals   ??? BP 171/84 (BP 1 Location: Right arm, BP Patient Position: At rest)   ??? Pulse 82   ??? Temp 98.8 ??F (37.1 ??C)   ??? Resp 20   ??? Ht 5\' 9"  (1.753 m)   ??? Wt 81.6 kg (180 lb)   ??? SpO2 92%   ??? BMI 26.58 kg/m2     SpO2 Readings from Last 6 Encounters:   01/20/16 92%          Intake/Output Summary (Last 24 hours) at 01/20/16 1048  Last data filed at 01/20/16 0803   Gross per 24 hour   Intake                0 ml   Output             1100 ml   Net            -1100 ml        Physical Exam:    Gen:  Well-developed, well-nourished, in no acute distress  HEENT:  Pink conjunctivae, PERRL, hearing intact to voice, moist mucous membranes  Neck:  Supple, without masses, thyroid non-tender  Resp:  No accessory muscle use, clear breath sounds without wheezes rales or rhonchi  Card:  No murmurs, normal S1, S2 without thrills, bruits or peripheral edema  Abd:  Soft, non-tender, non-distended, normoactive bowel sounds are present, no palpable organomegaly and no detectable hernias  Lymph:  No cervical or inguinal adenopathy  Musc:  No cyanosis or clubbing   Skin:  No rashes or ulcers, skin turgor is good  Neuro:  Cranial nerves are grossly intact, no focal motor weakness, follows commands appropriately  Psych:  Good insight, oriented to person, place and time, alert  __________________________________________________________________  Medications Reviewed: (see below)  Medications:     Current Facility-Administered Medications   Medication Dose Route Frequency   ??? metroNIDAZOLE (FLAGYL) IVPB premix 500 mg  500 mg IntraVENous Q8H   ??? mesalamine (CANASA) suppository 1,000 mg  1,000 mg Rectal QHS   ??? nystatin (MYCOSTATIN) 100,000 unit/gram powder   Topical BID   ??? cefepime (MAXIPIME) 2 g in 0.9% sodium chloride (MBP/ADV) 100 mL  2 g IntraVENous Q24H   ??? sodium chloride (NS) flush 5-10 mL  5-10 mL IntraVENous PRN   ??? sodium chloride (NS) flush 5-10 mL  5-10 mL IntraVENous Q8H   ??? sodium chloride (NS) flush 5-10 mL  5-10 mL IntraVENous PRN   ??? acetaminophen (TYLENOL) tablet 650 mg  650 mg Oral Q4H PRN   ??? ondansetron (ZOFRAN) injection 4 mg  4 mg IntraVENous Q4H PRN   ??? enoxaparin (LOVENOX) injection 40 mg  40 mg SubCUTAneous Q24H        Lab Data Reviewed: (see below)  Lab Review:     Recent Labs      01/19/16   1313   WBC  6.4   HGB  12.0*   HCT  37.2   PLT  202     Recent Labs      01/19/16   1313   NA  138   K  4.0   CL  105   CO2  27   GLU  86   BUN  10   CREA  0.76   CA  8.2*     No results found for: GLUCPOC  No results for input(s): PH, PCO2, PO2, HCO3, FIO2 in the last 72 hours.  No results for input(s): INR in the last 72 hours.    No lab exists for component: INREXT  All Micro Results     Procedure Component Value Units Date/Time    CULTURE, BLOOD [[id number]] Collected:  01/16/16 1321    Order Status:  Completed Specimen:  Blood from Blood Updated:  01/20/16 0829     Special Requests: NO SPECIAL REQUESTS        Culture result: NO GROWTH 4 DAYS       CULTURE, BLOOD [[id number]] Collected:  01/16/16 1312     Order Status:  Completed Specimen:  Blood from Blood Updated:  01/20/16 0829     Special Requests: NO SPECIAL REQUESTS        Culture result: NO GROWTH 4 DAYS       CULTURE, URINE [[id number]]  (Abnormal) Collected:  01/16/16 1457    Order Status:  Completed Specimen:  Urine Updated:  01/18/16 0714     Special Requests: --        NO SPECIAL REQUESTS  Reflexed from [id number]       Colony Count --        4000  COLONIES/mL       Culture result: PSEUDOMONAS SPECIES (A)       CULTURE, URINE [[id number]] Collected:  01/16/16 1545    Order Status:  Canceled Specimen:  Urine from Cath Urine     CULTURE, BLOOD [[id number]] Collected:  01/16/16 1330    Order Status:  Canceled Specimen:  Blood from Blood           I have reviewed notes of prior 24hr.    Other pertinent lab:     Total time spent with patient: 1335                  Care Plan discussed with: Patient, Nursing Staff and >50% of time spent in counseling and coordination of care    Discussed:  Care Plan    Prophylaxis:  Lovenox    Disposition:  Home w/Family           ___________________________________________________    Attending Physician: Otho DarnerMesfin Pride Gonzales, MD

## 2016-01-20 NOTE — Discharge Summary (Signed)
Hospitalist Discharge Summary     Patient ID:    Joshua Callahan  [id number]  82 y.o.  [birth year]    Admit date: 01/16/2016    Discharge date and time: 01/20/2016    Admission Diagnoses: Sepsis (HCC)  Sepsis (HCC)    Chronic Diagnoses:    Problem List as of 01/20/2016  Date Reviewed: 01/16/2016          Codes Class Noted - Resolved    History of colon cancer ICD-10-CM: Z85.038  ICD-9-CM: V10.05  01/19/2016 - Present        Colon cancer (HCC) ICD-10-CM: C18.9  ICD-9-CM: 153.9  Unknown - Present        RESOLVED: Pyuria ICD-10-CM: N39.0  ICD-9-CM: 791.9  01/19/2016 - 01/20/2016        RESOLVED: Edema ICD-10-CM: R60.9  ICD-9-CM: 782.3  Unknown - 01/20/2016        * (Principal)RESOLVED: Sepsis (HCC) ICD-10-CM: A41.9  ICD-9-CM: 038.9, 995.91  01/16/2016 - 01/20/2016        RESOLVED: Tachycardia ICD-10-CM: R00.0  ICD-9-CM: 785.0  01/16/2016 - 01/20/2016        RESOLVED: Fever ICD-10-CM: R50.9  ICD-9-CM: 780.60  01/16/2016 - 01/20/2016              Discharge Medications:   Current Discharge Medication List      START taking these medications    Details   mesalamine (CANASA) 1,000 mg suppository Insert 1 Suppository into rectum nightly.  Qty: 7 Suppository, Refills: 0         CONTINUE these medications which have NOT CHANGED    Details   acetaminophen-codeine (TYLENOL-CODEINE #3) 300-30 mg per tablet Take 1 Tab by mouth every four (4) hours as needed for Pain. Post hand surgery         STOP taking these medications       bumetanide (BUMEX) 0.5 mg tablet Comments:   Reason for Stopping:               Follow up Care:    1. Gustavo LahJames Zabawa, MD in 1-2 weeks  2. GI    Diet:  Regular Diet    Disposition:  Home.    Advanced Directive:    Discharge Exam:  See today's note.    CONSULTATIONS: GI    Significant Diagnostic Studies:   Recent Labs      01/19/16   1313   WBC  6.4   HGB  12.0*   HCT  37.2   PLT  202     Recent Labs      01/19/16   1313   NA  138   K  4.0   CL  105   CO2  27   BUN  10   CREA  0.76   GLU  86   CA  8.2*      No results for input(s): SGOT, GPT, ALT, AP, TBIL, TBILI, TP, ALB, GLOB, GGT, AML, LPSE in the last 72 hours.    No lab exists for component: AMYP, HLPSE  No results for input(s): INR, PTP, APTT in the last 72 hours.    No lab exists for component: INREXT   No results for input(s): FE, TIBC, PSAT, FERR in the last 72 hours.   No results for input(s): PH, PCO2, PO2 in the last 72 hours.  No results for input(s): CPK, CKMB in the last 72 hours.    No lab exists for component: TROPONINI  No results found for: GLUCPOC  HOSPITAL COURSE & TREATMENT RENDERED:   1.  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 Started on mesalamin suppositories for 7 days by GI. CT abdomen and pelvis is unremarkable. Follow up with Dr Dr Jeanell SparrowSobeiski as needed   ??  2.  UTI (01/16/2016): Cultures isolating <4000??Pseudomonal species. Continue IV Cefepime  ??  ????  3.  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 Out patient follow up with surgeon next week  ????  Pt is discharged in improved condition     Signed:  Otho DarnerMesfin Bates [place], MD  01/20/2016  11:59 AM

## 2016-01-20 NOTE — Progress Notes (Signed)
Problem: Falls - Risk of  Goal: *Absence of Falls  Document Schmid Fall Risk and appropriate interventions in the flowsheet.   Outcome: Progressing Towards Goal  Fall Risk Interventions:  Mobility Interventions: Patient to call before getting OOB           Medication Interventions: Patient to call before getting OOB     Elimination Interventions: Call light in reach, Patient to call for help with toileting needs, Toilet paper/wipes in reach

## 2016-01-22 LAB — CULTURE, BLOOD
Culture result:: NO GROWTH
Culture result:: NO GROWTH

## 2016-05-19 ENCOUNTER — Observation Stay (HOSPITAL_COMMUNITY)
Admission: EM | Admit: 2016-05-19 | Discharge: 2016-05-21 | Disposition: A | Discharge status: Home / Self Care | Payer: Medicare Other | Attending: Internal Medicine | Admitting: Internal Medicine

## 2016-05-19 ENCOUNTER — Encounter (HOSPITAL_COMMUNITY): Payer: Self-pay | Admitting: *Deleted

## 2016-05-19 ENCOUNTER — Emergency Department (HOSPITAL_COMMUNITY): Payer: Medicare Other

## 2016-05-19 DIAGNOSIS — G9389 Other specified disorders of brain: Secondary | ICD-10-CM | POA: Insufficient documentation

## 2016-05-19 DIAGNOSIS — R600 Localized edema: Secondary | ICD-10-CM | POA: Insufficient documentation

## 2016-05-19 DIAGNOSIS — Z9049 Acquired absence of other specified parts of digestive tract: Secondary | ICD-10-CM | POA: Diagnosis not present

## 2016-05-19 DIAGNOSIS — R55 Syncope and collapse: Principal | ICD-10-CM | POA: Diagnosis present

## 2016-05-19 DIAGNOSIS — Z85038 Personal history of other malignant neoplasm of large intestine: Secondary | ICD-10-CM | POA: Diagnosis not present

## 2016-05-19 DIAGNOSIS — I10 Essential (primary) hypertension: Secondary | ICD-10-CM | POA: Diagnosis not present

## 2016-05-19 DIAGNOSIS — G3189 Other specified degenerative diseases of nervous system: Secondary | ICD-10-CM | POA: Diagnosis not present

## 2016-05-19 DIAGNOSIS — Z87440 Personal history of urinary (tract) infections: Secondary | ICD-10-CM | POA: Insufficient documentation

## 2016-05-19 DIAGNOSIS — J320 Chronic maxillary sinusitis: Secondary | ICD-10-CM | POA: Diagnosis not present

## 2016-05-19 DIAGNOSIS — Z87891 Personal history of nicotine dependence: Secondary | ICD-10-CM | POA: Diagnosis not present

## 2016-05-19 DIAGNOSIS — I082 Rheumatic disorders of both aortic and tricuspid valves: Secondary | ICD-10-CM | POA: Diagnosis not present

## 2016-05-19 DIAGNOSIS — E663 Overweight: Secondary | ICD-10-CM | POA: Diagnosis not present

## 2016-05-19 DIAGNOSIS — R739 Hyperglycemia, unspecified: Secondary | ICD-10-CM | POA: Diagnosis not present

## 2016-05-19 DIAGNOSIS — Z79899 Other long term (current) drug therapy: Secondary | ICD-10-CM | POA: Insufficient documentation

## 2016-05-19 DIAGNOSIS — Z23 Encounter for immunization: Secondary | ICD-10-CM | POA: Diagnosis not present

## 2016-05-19 DIAGNOSIS — Z7982 Long term (current) use of aspirin: Secondary | ICD-10-CM | POA: Diagnosis not present

## 2016-05-19 DIAGNOSIS — Z6827 Body mass index (BMI) 27.0-27.9, adult: Secondary | ICD-10-CM | POA: Insufficient documentation

## 2016-05-19 DIAGNOSIS — I672 Cerebral atherosclerosis: Secondary | ICD-10-CM | POA: Diagnosis not present

## 2016-05-19 DIAGNOSIS — R4781 Slurred speech: Secondary | ICD-10-CM | POA: Diagnosis not present

## 2016-05-19 DIAGNOSIS — E441 Mild protein-calorie malnutrition: Secondary | ICD-10-CM | POA: Diagnosis present

## 2016-05-19 HISTORY — DX: Unspecified hearing loss, unspecified ear: H91.90

## 2016-05-19 HISTORY — DX: Urinary tract infection, site not specified: N39.0

## 2016-05-19 HISTORY — DX: Malignant neoplasm of colon, unspecified: C18.9

## 2016-05-19 LAB — I-STAT CHEM 8, ED
BUN: 20 mg/dL (ref 6–20)
CHLORIDE: 104 mmol/L (ref 101–111)
Calcium, Ion: 0.84 mmol/L — CL (ref 1.15–1.40)
Creatinine, Ser: 0.9 mg/dL (ref 0.61–1.24)
Glucose, Bld: 108 mg/dL — ABNORMAL HIGH (ref 65–99)
HEMATOCRIT: 39 % (ref 39.0–52.0)
Hemoglobin: 13.3 g/dL (ref 13.0–17.0)
POTASSIUM: 6.6 mmol/L — AB (ref 3.5–5.1)
Sodium: 130 mmol/L — ABNORMAL LOW (ref 135–145)
TCO2: 23 mmol/L (ref 0–100)

## 2016-05-19 LAB — DIFFERENTIAL
BASOS ABS: 0 10*3/uL (ref 0.0–0.1)
Basophils Relative: 0 %
EOS ABS: 0.2 10*3/uL (ref 0.0–0.7)
Eosinophils Relative: 3 %
Lymphocytes Relative: 15 %
Lymphs Abs: 1 10*3/uL (ref 0.7–4.0)
MONO ABS: 0.6 10*3/uL (ref 0.1–1.0)
Monocytes Relative: 9 %
NEUTROS PCT: 73 %
Neutro Abs: 4.8 10*3/uL (ref 1.7–7.7)

## 2016-05-19 LAB — CBC
HCT: 40.7 % (ref 39.0–52.0)
HEMOGLOBIN: 13.1 g/dL (ref 13.0–17.0)
MCH: 29.8 pg (ref 26.0–34.0)
MCHC: 32.2 g/dL (ref 30.0–36.0)
MCV: 92.7 fL (ref 78.0–100.0)
PLATELETS: 196 10*3/uL (ref 150–400)
RBC: 4.39 MIL/uL (ref 4.22–5.81)
RDW: 16.3 % — ABNORMAL HIGH (ref 11.5–15.5)
WBC: 6.6 10*3/uL (ref 4.0–10.5)

## 2016-05-19 LAB — COMPREHENSIVE METABOLIC PANEL
ALT: 28 U/L (ref 17–63)
AST: 38 U/L (ref 15–41)
Albumin: 3.3 g/dL — ABNORMAL LOW (ref 3.5–5.0)
Alkaline Phosphatase: 54 U/L (ref 38–126)
Anion gap: 11 (ref 5–15)
BUN: 12 mg/dL (ref 6–20)
CHLORIDE: 101 mmol/L (ref 101–111)
CO2: 25 mmol/L (ref 22–32)
CREATININE: 0.99 mg/dL (ref 0.61–1.24)
Calcium: 9.1 mg/dL (ref 8.9–10.3)
GFR calc non Af Amer: >60 mL/min (ref >60)
Glucose, Bld: 106 mg/dL — ABNORMAL HIGH (ref 65–99)
POTASSIUM: 4.2 mmol/L (ref 3.5–5.1)
SODIUM: 137 mmol/L (ref 135–145)
Total Bilirubin: 0.5 mg/dL (ref 0.3–1.2)
Total Protein: 6.3 g/dL — ABNORMAL LOW (ref 6.5–8.1)

## 2016-05-19 LAB — I-STAT TROPONIN, ED
TROPONIN I, POC: 0.01 ng/mL (ref 0.00–0.08)
Troponin i, poc: 0.01 ng/mL (ref 0.00–0.08)

## 2016-05-19 LAB — CBG MONITORING, ED: GLUCOSE-CAPILLARY: 93 mg/dL (ref 65–99)

## 2016-05-19 LAB — PROTIME-INR
INR: 1.01
Prothrombin Time: 13.3 seconds (ref 11.4–15.2)

## 2016-05-19 LAB — APTT: APTT: 21 s — AB (ref 24–36)

## 2016-05-19 NOTE — Consult Note (Signed)
Referring Physician: Dr. Kathrynn Humble    Chief Complaint: Dysphasia  HPI: Curtis Chavez is an 82 y.o. male who presents for evaluation of possible stroke. He was taking a seated shower at home when family heard him fall onto his shower chair. Family went to check on him and he was unconscious. It took 5 minutes for him to awaken but continued to stare blankly for about 5 minutes. They called EMS. On EMS arrival, when asked to get up he could not stand up. After about 7-10 minutes he was able to get up with max assist. After 15 minutes at the scene EMS noted that he started to be able to talk again but speech output was jumbled and nonsensical, other than being able to tell EMS his son's name. CBG on scene was 143. Initial BP was 138/72. By the time of arrival to the ED, his speech had improved significantly.   CT reveals no hemorrhage or acute changes. Diffuse atrophy and chronic small vessel ischemic changes are noted.   LSN: 20:45 tPA Given: No, due to NIHSS of 0  No past medical history on file.  No past surgical history on file.  No family history on file. Social History:  has no tobacco, alcohol, and drug history on file.  Allergies: Allergies not on file  Medications:   Current Meds  Medication Sig  . PRESCRIPTION MEDICATION Take 1 tablet by mouth every other day. Diuretic that starts with a "B"     Current Facility-Administered Medications:  .  acetaminophen (TYLENOL) tablet 650 mg, 650 mg, Oral, Q6H PRN **OR** acetaminophen (TYLENOL) suppository 650 mg, 650 mg, Rectal, Q6H PRN, Karmen Bongo, MD .  aspirin chewable tablet 81 mg, 81 mg, Oral, Daily, Karmen Bongo, MD .  enoxaparin (LOVENOX) injection 30 mg, 30 mg, Subcutaneous, Q24H, Karmen Bongo, MD .  lactated ringers infusion, , Intravenous, Continuous, Karmen Bongo, MD, Last Rate: 75 mL/hr at 05/20/16 0453 .  morphine 2 MG/ML injection 2 mg, 2 mg, Intravenous, Q2H PRN, Karmen Bongo, MD .  ondansetron [facility]) tablet 4  mg, 4 mg, Oral, Q6H PRN **OR** ondansetron (ZOFRAN) injection 4 mg, 4 mg, Intravenous, Q6H PRN, Karmen Bongo, MD .  Derrill Memo ON 05/21/2016] pneumococcal 23 valent vaccine (PNU-IMMUNE) injection 0.5 mL, 0.5 mL, Intramuscular, Tomorrow-1000, Karmen Bongo, MD .  sodium chloride flush (NS) 0.9 % injection 3 mL, 3 mL, Intravenous, Q12H, Karmen Bongo, MD   ROS: As per HPI.   Physical Examination: Weight 82.7 kg (182 lb 5.1 oz).  General examination: [place]/AT. Nondiaphoretic, noncyanotic. Colostomy bag noted. Extremities warm and well perfused. Pitting edema distally below knees is present bilaterally.   Neurologic Examination: Ment: Awake and alert. Speech output is sparse, but fluent without errors of grammar/syntax. Follows all commands. Naming intact. Mild confusion noted.  CN: PERRL. Visual fields intact. Visual pursuits are full. No nystagmus noted. Facial sensation grossly normal. Face symmetric. Hard of hearing. No hypophonia or hoarseness. XI symmetric. Tongue protrudes midline.  Motor: 5/5 in all 4 extremities.  Sensory: Intact to fine touch x 4 without extinction.  Reflexes: Hypoactive lower extremity reflexes. 1+ biceps and brachioradialis bilaterally. Toes mute.  Cerebellar: No ataxia with FNF bilaterally.  Gait: Deferred due to falls risk concerns.   Results for orders placed or performed during the hospital encounter of 05/19/16 (from the past 48 hour(s))  POC CBG, ED     Status: None   Collection Time: 05/19/16  9:47 PM  Result Value Ref Range   Glucose-Capillary 93 65 -  99 mg/dL   No results found.  Assessment: 82 y.o. male with acute mental status change and diffuse weakness, now resolved 1. CT head revealed no acute findings. 2. Most likely component of the DDx for the patient's presentation is felt to be syncope followed by confusion. DDx also includes transient alteration of consciousness in possible incipient dementia, seizure with postictal state, AMS in the setting  of UTI and TIA. If TIA, then distal left MCA is the most likely territory affected, specifically a branch supplying the perisylvian cortices.  3. Agree with admitting team's DDx regarding possible etiologies for syncope.   Plan: 1. MRI brain, MRA head and neck, TTE.  2. Start ASA 81 mg po qd.  3. Permissive HTN.  4. PT/OT/Speech. 5. Cardiac telemetry.  6. Management of UTI.  7. EEG.  8. Syncope work up as per primary team.   @Electronically  signed: Dr. Kerney Elbe   @strokeconsult @ 05/19/2016, 9:53 PM

## 2016-05-19 NOTE — ED Notes (Signed)
EMS reported patient to have garbeled speech and left sided gfaze.  BP 138/72, P 90, CBG 143.  Only meds he takes is a diuretic and only takes when his son can crush it up in a drink

## 2016-05-19 NOTE — ED Provider Notes (Signed)
[facility] DEPT Provider Note   CSN: [id number] Arrival date & time: 05/19/16  2144   An emergency department physician performed an initial assessment on this suspected stroke patient at 2144.  History   Chief Complaint Chief Complaint  Patient presents with  . Code Stroke  . Near Syncope    HPI Curtis Chavez is a 82 y.o. male.  HPI 82 y.o.with hx of colon CA, recurrent UTI presents with syncope and possible stroke.  Per son, pt was taking a seated shower at home when family checked on him he was unresponsive. Pt then had a blank starring spell for about 5 minutes. When EMS arrived pt was confused. Pt also had some slurred speech. En route, pt is noted to be more talkative and the symptoms have improved. Pt doesn't recall going into the shower. He does remember that ambulance brought him here.  Past Medical History:  Diagnosis Date  . Colon cancer ([place])   . HOH (hard of hearing)   . UTI (urinary tract infection)     There are no active problems to display for this patient.   Past Surgical History:  Procedure Laterality Date  . COLON SURGERY         Home Medications    Prior to Admission medications   Medication Sig Start Date End Date Taking? Authorizing Provider  PRESCRIPTION MEDICATION Take 1 tablet by mouth every other day. Diuretic that starts with a "B"   Yes Historical Provider, MD    Family History No family history on file.  Social History Social History  Substance Use Topics  . Smoking status: Former Smoker    Types: Cigars  . Smokeless tobacco: Never Used  . Alcohol use Yes     Comment: no for approx 20 yrs     Allergies   Patient has no known allergies.   Review of Systems Review of Systems  ROS 10 Systems reviewed and are negative for acute change except as noted in the HPI.     Physical Exam Updated Vital Signs BP 130/75 (BP Location: Left Arm)   Pulse 83   Temp 98.4 F (36.9 C)   Resp 20   Wt 182 lb 5.1 oz (82.7 kg)    SpO2 96%   Physical Exam  Constitutional: He is oriented to person, place, and time. He appears well-developed.  HENT:  Head: Normocephalic and atraumatic.  Eyes: Conjunctivae and EOM are normal. Pupils are equal, round, and reactive to light.  Neck: Normal range of motion. Neck supple.  Cardiovascular: Normal rate, regular rhythm and normal heart sounds.   Pulmonary/Chest: Effort normal and breath sounds normal. No respiratory distress. He has no wheezes.  Abdominal: Soft. Bowel sounds are normal. He exhibits no distension. There is no tenderness. There is no rebound and no guarding.  Neurological: He is alert and oriented to person, place, and time. No cranial nerve deficit. Coordination normal.  Skin: Skin is warm.  Nursing note and vitals reviewed.    ED Treatments / Results  Labs (all labs ordered are listed, but only abnormal results are displayed) Labs Reviewed  APTT - Abnormal; Notable for the following:       Result Value   aPTT 21 (*)    All other components within normal limits  CBC - Abnormal; Notable for the following:    RDW 16.3 (*)    All other components within normal limits  COMPREHENSIVE METABOLIC PANEL - Abnormal; Notable for the following:    Glucose, Bld  106 (*)    Total Protein 6.3 (*)    Albumin 3.3 (*)    All other components within normal limits  I-STAT CHEM 8, ED - Abnormal; Notable for the following:    Sodium 130 (*)    Potassium 6.6 (*)    Glucose, Bld 108 (*)    Calcium, Ion 0.84 (*)    All other components within normal limits  PROTIME-INR  DIFFERENTIAL  ETHANOL  RAPID URINE DRUG SCREEN, HOSP PERFORMED  URINALYSIS, ROUTINE W REFLEX MICROSCOPIC  I-STAT TROPOININ, ED  CBG MONITORING, ED    EKG  EKG Interpretation  Date/Time:  Wednesday May 19 2016 22:15:08 EST Ventricular Rate:  85 PR Interval:    QRS Duration: 87 QT Interval:  359 QTC Calculation: 427 R Axis:   67 Text Interpretation:  Sinus rhythm Short PR interval No  acute changes No old tracing to compare Confirmed by Kathrynn Humble, MD, Thelma Comp [phone]) on 05/19/2016 10:38:48 PM       Radiology Ct Head Code Stroke W/o Cm  Result Date: 05/19/2016 CLINICAL DATA:  Code stroke. Initial evaluation for acute syncope with left-sided weakness. Confusion. EXAM: CT HEAD WITHOUT CONTRAST TECHNIQUE: Contiguous axial images were obtained from the base of the skull through the vertex without intravenous contrast. COMPARISON:  None available. FINDINGS: Brain: Generalized cerebral atrophy with moderate chronic microvascular ischemic disease. Small remote right cerebellar and right occipital lobe infarcts noted. Additional small remote left occipital lobe infarct noted. Remote lacunar infarcts within the bilateral basal ganglia. No acute intracranial hemorrhage. No evidence for acute large vessel territory infarct. No mass lesion, midline shift or mass effect. No hydrocephalus. No extra-axial fluid collection. Vascular: Intracranial atherosclerotic disease involving the carotid siphons and distal vertebral arteries noted. No hyperdense vessel. Skull: Scalp soft tissues demonstrate no acute abnormality. Calvarium intact. Sinuses/Orbits: Globes and orbital soft tissues within normal limits. Patient is status post lens extraction bilaterally. Chronic right maxillary sinusitis noted. Paranasal sinuses are otherwise clear. No mastoid effusion. ASPECTS [facility] Stroke Program Early CT Score) - Ganglionic level infarction (caudate, lentiform nuclei, internal capsule, insula, M1-M3 cortex): 7 - Supraganglionic infarction (M4-M6 cortex): 3 Total score (0-10 with 10 being normal): 10 IMPRESSION: 1. No acute intracranial infarct or other process identified. 2. ASPECTS is 10 3. Generalized age-related cerebral atrophy with chronic microvascular ischemic disease and multiple remote lacunar infarcts as above. 4. Chronic right maxillary sinusitis. Critical Value/emergent results were called by telephone at  the time of interpretation on 05/19/2016 at 10:10 pm to Dr. Cheral Marker, who verbally acknowledged these results. Electronically Signed   By: Jeannine Boga M.D.   On: 05/19/2016 22:11    Procedures Procedures (including critical care time)  Medications Ordered in ED Medications - No data to display   Initial Impression / Assessment and Plan / ED Course  I have reviewed the triage vital signs and the nursing notes.  Pertinent labs & imaging results that were available during my care of the patient were reviewed by me and considered in my medical decision making (see chart for details).  Clinical Course     Pt comes in with cc of unresponsive spell. Code stroke activated on the field. DDX: TIA vs. Seizure vs. Syncope Stroke team already assessed, and recommend admission.  Family reports that pt has had UTI in the past where he fainted- UA ordered.  Final Clinical Impressions(s) / ED Diagnoses   Final diagnoses:  Syncope and collapse    New Prescriptions New Prescriptions   No medications on  file     Varney Biles, MD 05/19/16 [phone]

## 2016-05-19 NOTE — Progress Notes (Signed)
Curtis Chavez is a 82 yo male coming from home via [facility]. He was taking a shower when family heard him fall, found he had fallen onto his shower chair. He was unable to stand up on his own and in need of assistance which is not his norm. Ems also noted pt to have garbled speech which subsided prior to arrival to ED. CBG 143, NIHSS 0, LKW 2045. Code stroke cancelled per Dr. Cheral Marker.  Pt had same symptoms in December, diagnosed with a UTI.  Pt to be admitted to triad

## 2016-05-19 NOTE — ED Notes (Signed)
Numerous attempts to state IV without success.

## 2016-05-20 ENCOUNTER — Observation Stay (HOSPITAL_COMMUNITY): Payer: Medicare Other

## 2016-05-20 ENCOUNTER — Encounter (HOSPITAL_COMMUNITY): Payer: Self-pay | Admitting: Internal Medicine

## 2016-05-20 ENCOUNTER — Observation Stay (HOSPITAL_BASED_OUTPATIENT_CLINIC_OR_DEPARTMENT_OTHER): Payer: Medicare Other

## 2016-05-20 DIAGNOSIS — R739 Hyperglycemia, unspecified: Secondary | ICD-10-CM | POA: Diagnosis present

## 2016-05-20 DIAGNOSIS — R4781 Slurred speech: Secondary | ICD-10-CM | POA: Diagnosis not present

## 2016-05-20 DIAGNOSIS — R55 Syncope and collapse: Secondary | ICD-10-CM

## 2016-05-20 DIAGNOSIS — R600 Localized edema: Secondary | ICD-10-CM | POA: Diagnosis not present

## 2016-05-20 DIAGNOSIS — Z85038 Personal history of other malignant neoplasm of large intestine: Secondary | ICD-10-CM | POA: Diagnosis not present

## 2016-05-20 DIAGNOSIS — E441 Mild protein-calorie malnutrition: Secondary | ICD-10-CM | POA: Diagnosis present

## 2016-05-20 LAB — VAS US CAROTID
LCCADDIAS: 7 cm/s
LCCAPSYS: 147 cm/s
LEFT ECA DIAS: 7 cm/s
LEFT VERTEBRAL DIAS: -4 cm/s
LICADSYS: -56 cm/s
Left CCA dist sys: 121 cm/s
Left CCA prox dias: 13 cm/s
Left ICA dist dias: -11 cm/s
Left ICA prox dias: -11 cm/s
Left ICA prox sys: -50 cm/s
RCCADSYS: -54 cm/s
RCCAPDIAS: 6 cm/s
RIGHT ECA DIAS: -4 cm/s
RIGHT VERTEBRAL DIAS: -6 cm/s
Right CCA prox sys: 104 cm/s

## 2016-05-20 LAB — CBC
HCT: 37 % — ABNORMAL LOW (ref 39.0–52.0)
HEMOGLOBIN: 11.9 g/dL — AB (ref 13.0–17.0)
MCH: 30.3 pg (ref 26.0–34.0)
MCHC: 32.2 g/dL (ref 30.0–36.0)
MCV: 94.1 fL (ref 78.0–100.0)
Platelets: 228 10*3/uL (ref 150–400)
RBC: 3.93 MIL/uL — AB (ref 4.22–5.81)
RDW: 16.6 % — ABNORMAL HIGH (ref 11.5–15.5)
WBC: 6.1 10*3/uL (ref 4.0–10.5)

## 2016-05-20 LAB — BASIC METABOLIC PANEL
ANION GAP: 9 (ref 5–15)
BUN: 11 mg/dL (ref 6–20)
CHLORIDE: 104 mmol/L (ref 101–111)
CO2: 24 mmol/L (ref 22–32)
Calcium: 8.9 mg/dL (ref 8.9–10.3)
Creatinine, Ser: 0.86 mg/dL (ref 0.61–1.24)
GFR calc non Af Amer: >60 mL/min (ref >60)
Glucose, Bld: 109 mg/dL — ABNORMAL HIGH (ref 65–99)
POTASSIUM: 4 mmol/L (ref 3.5–5.1)
SODIUM: 137 mmol/L (ref 135–145)

## 2016-05-20 LAB — POCT I-STAT, CHEM 8
BUN: 20 mg/dL (ref 6–20)
CALCIUM ION: 0.84 mmol/L — AB (ref 1.15–1.40)
CREATININE: 0.9 mg/dL (ref 0.61–1.24)
Chloride: 104 mmol/L (ref 101–111)
GLUCOSE: 108 mg/dL — AB (ref 65–99)
HCT: 39 % (ref 39.0–52.0)
HEMOGLOBIN: 13.3 g/dL (ref 13.0–17.0)
Potassium: 6.6 mmol/L (ref 3.5–5.1)
SODIUM: 130 mmol/L — AB (ref 135–145)
TCO2: 23 mmol/L (ref 0–100)

## 2016-05-20 LAB — TROPONIN I
Troponin I: <0.03 ng/mL (ref <0.03)
Troponin I: <0.03 ng/mL (ref <0.03)

## 2016-05-20 LAB — LIPID PANEL
Cholesterol: 144 mg/dL (ref 0–200)
HDL: 51 mg/dL (ref >40)
LDL CALC: 88 mg/dL (ref 0–99)
TRIGLYCERIDES: 27 mg/dL (ref <150)
Total CHOL/HDL Ratio: 2.8 RATIO
VLDL: 5 mg/dL (ref 0–40)

## 2016-05-20 LAB — TSH: TSH: 1.561 u[IU]/mL (ref 0.350–4.500)

## 2016-05-20 LAB — RAPID URINE DRUG SCREEN, HOSP PERFORMED
Amphetamines: NOT DETECTED
Barbiturates: NOT DETECTED
Benzodiazepines: NOT DETECTED
Cocaine: NOT DETECTED
Opiates: NOT DETECTED
Tetrahydrocannabinol: NOT DETECTED

## 2016-05-20 LAB — URINALYSIS, ROUTINE W REFLEX MICROSCOPIC
Bilirubin Urine: NEGATIVE
Glucose, UA: NEGATIVE mg/dL
Ketones, ur: NEGATIVE mg/dL
LEUKOCYTES UA: NEGATIVE
NITRITE: NEGATIVE
Protein, ur: NEGATIVE mg/dL
SPECIFIC GRAVITY, URINE: 1.015 (ref 1.005–1.030)
pH: 5.5 (ref 5.0–8.0)

## 2016-05-20 LAB — ECHOCARDIOGRAM COMPLETE
HEIGHTINCHES: 67 in
WEIGHTICAEL: 2825.6 [oz_av]

## 2016-05-20 LAB — URINALYSIS, MICROSCOPIC (REFLEX): Squamous Epithelial / LPF: NONE SEEN

## 2016-05-20 MED ORDER — ACETAMINOPHEN 650 MG RE SUPP: 650.0000 mg | Freq: Four times a day (QID) | RECTAL | Status: DC | PRN

## 2016-05-20 MED ORDER — LACTATED RINGERS IV SOLN
INTRAVENOUS | Status: DC
  Administered 2016-05-20: 05:00:00 via INTRAVENOUS

## 2016-05-20 MED ORDER — PNEUMOCOCCAL VAC POLYVALENT 25 MCG/0.5ML IJ INJ
0.5000 mL | INJECTION | INTRAMUSCULAR | Status: AC
  Administered 2016-05-21: 0.5 mL via INTRAMUSCULAR
  Filled 2016-05-20: qty 0.5

## 2016-05-20 MED ORDER — ACETAMINOPHEN 325 MG PO TABS: 650.0000 mg | ORAL_TABLET | Freq: Four times a day (QID) | ORAL | Status: DC | PRN

## 2016-05-20 MED ORDER — MORPHINE SULFATE (PF) 2 MG/ML IV SOLN: 2.0000 mg | INTRAVENOUS | Status: DC | PRN

## 2016-05-20 MED ORDER — ENOXAPARIN SODIUM 30 MG/0.3ML ~~LOC~~ SOLN
30.0000 mg | SUBCUTANEOUS | Status: DC
  Administered 2016-05-20 – 2016-05-21 (×2): 30 mg via SUBCUTANEOUS
  Filled 2016-05-20 (×2): qty 0.3

## 2016-05-20 MED ORDER — BUMETANIDE 0.5 MG PO TABS
0.5000 mg | ORAL_TABLET | ORAL | Status: DC
  Administered 2016-05-20: 0.5 mg via ORAL
  Filled 2016-05-20: qty 1

## 2016-05-20 MED ORDER — ONDANSETRON HCL 4 MG PO TABS: 4.0000 mg | ORAL_TABLET | Freq: Four times a day (QID) | ORAL | Status: DC | PRN

## 2016-05-20 MED ORDER — SODIUM CHLORIDE 0.9% FLUSH
3.0000 mL | Freq: Two times a day (BID) | INTRAVENOUS | Status: DC
  Administered 2016-05-20 – 2016-05-21 (×3): 3 mL via INTRAVENOUS

## 2016-05-20 MED ORDER — ASPIRIN 81 MG PO CHEW
81.0000 mg | CHEWABLE_TABLET | Freq: Every day | ORAL | Status: DC
  Administered 2016-05-20 – 2016-05-21 (×2): 81 mg via ORAL
  Filled 2016-05-20 (×2): qty 1

## 2016-05-20 MED ORDER — ONDANSETRON HCL 4 MG/2ML IJ SOLN: 4.0000 mg | Freq: Four times a day (QID) | INTRAMUSCULAR | Status: DC | PRN

## 2016-05-20 NOTE — Progress Notes (Signed)
VASCULAR LAB PRELIMINARY  PRELIMINARY  PRELIMINARY  PRELIMINARY  Carotid duplex completed.    Preliminary report:  1-39% ICA plaquing.  Vertebral artery flow is antegrade.   Taraya Steward, RVT 05/20/2016, 3:08 PM

## 2016-05-20 NOTE — ED Notes (Signed)
No answer when attempted to call report.

## 2016-05-20 NOTE — Procedures (Signed)
EEG report.  Brief clinical history: 82 y.o. male with prior history of colon cancer status post colectomy-presented to the ED following a syncopal episode when he was taking a shower. Differential diagnosis is syncope versus seizures. No prior history of frank epileptic seizures.  Technique: this is a 17 channel routine scalp EEG performed at the bedside with bipolar and monopolar montages arranged in accordance to the international 10/20 system of electrode placement. One channel was dedicated to EKG recording.  The study was performed during wakefulness, drowsiness, and stage 2 sleep. No activating procedures performed.  Description:In the wakeful state, the best background consisted of a medium amplitude, posterior dominant, well sustained, symmetric and reactive 12 Hz rhythm. Drowsiness demonstrated dropout of the alpha rhythm. Stage 2 sleep showed symmetric and synchronous sleep spindles without intermixed epileptiform discharges. No focal or generalized epileptiform discharges noted.  No pathologic areas of slowing seen.  EKG showed sinus rhythm.  Impression: this is a normal awake and asleep EEG. Please, be aware that a normal EEG does not exclude the possibility of epilepsy.  Clinical correlation is advised.  Dorian Pod, MD

## 2016-05-20 NOTE — Care Management Note (Signed)
Case Management Note  Patient Details  Name: Khani Ebel MRN: [id number] Date of Birth: 07-Oct-1917  Subjective/Objective:        Adm w syncope            Action/Plan: staying w son and da in [place]. Went over [employer] and son to [employer] in [place].   Expected Discharge Date:                  Expected Discharge Plan:  [place]  In-House Referral:     Discharge planning Services  CM Consult  Post Acute Care Choice:  Home Health Choice offered to:  Adult Children  DME Arranged:    DME Agency:     HH Arranged:  PT, OT, Nurse's Aide [place] Agency:     Status of Service:     If discussed at [place] of Stay Meetings, dates discussed:    Additional Comments:will await fam decision on agency.   Lacretia Leigh, RN 05/20/2016, 3:11 PM

## 2016-05-20 NOTE — Progress Notes (Signed)
OT Cancellation Note  Patient Details Name: Curtis Chavez MRN: [id number] DOB: 1917-09-10   Cancelled Treatment:    Reason Eval/Treat Not Completed: Patient at procedure or test/ unavailable;Other (comment) (Echo), Will reattempt OT eval as schedule permits.  Clover Mealy OTR/L Pager: [phone]  05/20/2016, 9:16 AM

## 2016-05-20 NOTE — Progress Notes (Signed)
  Echocardiogram 2D Echocardiogram has been performed.  Darlina Sicilian M 05/20/2016, 9:50 AM

## 2016-05-20 NOTE — Progress Notes (Signed)
Patient arrived to room via ED staff and family at bedside.  Oriented to room/unit. Vitals stable, tele applied and verified.  Continue to monitor patient.

## 2016-05-20 NOTE — Care Management Obs Status (Signed)
[facility] NOTIFICATION   Patient Details  Name: Curtis Chavez MRN: [id number] Date of Birth: 11/08/1917   Medicare Observation Status Notification Given:  Yes    Lacretia Leigh, RN 05/20/2016, 11:37 AM

## 2016-05-20 NOTE — Evaluation (Signed)
Occupational Therapy Evaluation Patient Details Name: Curtis Chavez MRN: [id number] DOB: 30-Jul-1917 Today's Date: 05/20/2016    History of Present Illness 82 y.o. male who presents for evaluation of possible stroke.CT reveals no hemorrhage or acute changes. Diffuse atrophy and chronic small vessel ischemic changes are noted.    Clinical Impression   Pt admitted with the above diagnoses and presents with below problem list. Pt will benefit from continued acute OT to address the below listed deficits and maximize independence with basic ADLs prior to d/c to venue below. PTA pt was living with family and pt reports needing some assistance to complete shower transfers. No family present on eval and pt is questionable historian so PLOF/home equipment data is somewhat limited. Pt min guard with functional transfers and in-room mobility. Did not attempt shower transfer this session. Session details below.      Follow Up Recommendations  Home health OT;Supervision/Assistance - 24 hour    Equipment Recommendations  None recommended by OT    Recommendations for Other Services PT consult     Precautions / Restrictions Precautions Precautions: Fall Restrictions Weight Bearing Restrictions: No      Mobility Bed Mobility Overal bed mobility: Needs Assistance Bed Mobility: Supine to Sit     Supine to sit: Min guard     General bed mobility comments: Pt using bed rail and momentum to come to EOB with HOB mostly flat. Min guard for safety.   Transfers Overall transfer level: Needs assistance Equipment used: Rolling walker (2 wheeled) Transfers: Sit to/from Stand Sit to Stand: Min guard         General transfer comment: from EOB and recliner    Balance Overall balance assessment: Needs assistance Sitting-balance support: No upper extremity supported;Feet supported Sitting balance-Leahy Scale: Fair     Standing balance support: Bilateral upper extremity supported;During  functional activity Standing balance-Leahy Scale: Fair                              ADL Overall ADL's : Needs assistance/impaired Eating/Feeding: Set up;Sitting   Grooming: Set up;Sitting   Upper Body Bathing: Set up;Sitting   Lower Body Bathing: Min guard;Sit to/from stand   Upper Body Dressing : Set up;Sitting   Lower Body Dressing: Min guard;Sit to/from stand   Toilet Transfer: Min guard;Ambulation;RW   Toileting- Water quality scientist and Hygiene: Min guard;Sit to/from stand   Tub/ Shower Transfer: Min guard;Ambulation;Rolling walker   Functional mobility during ADLs: Min guard;Rolling walker General ADL Comments: Pt completed bed mobility, in-room functional mobility and transfer to recliner as detailed above.      Vision Additional Comments: Pt able to read sentence off paper in front of him with no observed difficulty.    Perception     Praxis      Pertinent Vitals/Pain Pain Assessment: Faces Faces Pain Scale: No hurt     Hand Dominance     Extremity/Trunk Assessment Upper Extremity Assessment Upper Extremity Assessment: LUE deficits/detail;Difficult to assess due to impaired cognition LUE Deficits / Details: Pt remarking a few times that he is able to use his left hand more today "I would have dropped this (cutlery) yesterday." Pt using B hands to cut food with no assistance.    Lower Extremity Assessment Lower Extremity Assessment: Defer to PT evaluation       Communication Communication Communication: HOH   Cognition Arousal/Alertness: Awake/alert Behavior During Therapy: WFL for tasks assessed/performed Overall Cognitive Status: No family/caregiver  present to determine baseline cognitive functioning                 General Comments: tangental responses at times, STM impaired. perseverates.    General Comments       Exercises       Shoulder Instructions      Home Living Family/patient expects to be discharged to::  Private residence Living Arrangements: Children;Other (Comment) (daughter and son-in-law) Available Help at Discharge: Family;Other (Comment) (son and daughter-in-law)                             Additional Comments: Pt is questionable historian. No family present on eval.       Prior Functioning/Environment Level of Independence: Needs assistance  Gait / Transfers Assistance Needed: used rw, assist to get in and out of shower per pt report ADL's / Homemaking Assistance Needed: reports assist with shower transfers            OT Problem List: Impaired balance (sitting and/or standing);Decreased knowledge of use of DME or AE;Decreased knowledge of precautions;Decreased cognition   OT Treatment/Interventions: Self-care/ADL training;Energy conservation;DME and/or AE instruction;Therapeutic activities;Cognitive remediation/compensation;Patient/family education;Balance training    OT Goals(Current goals can be found in the care plan section) Acute Rehab OT Goals Patient Stated Goal: not stated OT Goal Formulation: With patient Time For Goal Achievement: 05/27/16 Potential to Achieve Goals: Good ADL Goals Pt Will Perform Lower Body Bathing: sit to/from stand;with supervision Pt Will Perform Lower Body Dressing: with supervision;sit to/from stand Pt Will Transfer to Toilet: with supervision;ambulating Pt Will Perform Toileting - Clothing Manipulation and hygiene: with supervision;sit to/from stand Pt Will Perform Tub/Shower Transfer: with min guard assist;ambulating;rolling walker  OT Frequency: Min 2X/week   Barriers to D/C:            Co-evaluation              End of Session Equipment Utilized During Treatment: Gait belt;Rolling walker Nurse Communication: Other (comment);Mobility status (difficulty with chair alarm pad. No new pads on unit.)  Activity Tolerance: Patient tolerated treatment well Patient left: in chair;with call bell/phone within reach    Time: 1155-1216 OT Time Calculation (min): 21 min Charges:  OT General Charges $OT Visit: 1 Procedure OT Evaluation $OT Eval Low Complexity: 1 Procedure G-Codes: OT G-codes **NOT FOR INPATIENT CLASS** Functional Assessment Tool Used: clinical judgement Functional Limitation: Self care Self Care Current Status [id number]): At least 1 percent but less than 20 percent impaired, limited or restricted Self Care Goal Status [id number]): At least 1 percent but less than 20 percent impaired, limited or restricted  Hortencia Pilar 05/20/2016, 12:52 PM

## 2016-05-20 NOTE — Progress Notes (Signed)
EEG Completed; Results Pending  

## 2016-05-20 NOTE — H&P (Addendum)
History and Physical    Curtis Chavez [id number] DOB: 1917-12-27 DOA: 05/19/2016  PCP: Inetta Fermo Consultants:  Oncologist; hand surgeon Patient coming from: son's house for the last few months; NOK: daughter, [phone]  Chief Complaint: syncope  HPI: Curtis Chavez is a 82 y.o. male with medical history significant of 2 recent UTIs requiring hospitalization and h/o colon cancer s/p colostomy presenting with syncopal episode.  Patient was taking a shower, sits in a shower chair.  Son was in bedroom next to bathroom.  Went to check on patient.  He heard the patient's back hit the wall.  Did not fall out of chair, but he was unconscious.  It took 5 minutes for him to wake up and then another 20 minutes with incoherence and slurring.  By the time he got to the ER, he was as usual.  He was his usual self prior to this episode.    Has had 2 prior UTIs since August - hospitalized in August and October in [place].  With the October UTI, his presentation was very similar.  Urinary frequency, dark yellow in color.  Has not complained of urinary symptoms.    He moved in son Oct 22.  He was also here for a couple of weeks in Sept.  At that time, he walked with a cane and was able to shower independently and dress independently.  No longer able to do those things without a lot of assistance.  Sister hurt her back and so is unable to care for him at this time.  ED Course: Per Dr. Kathrynn Humble: Pt comes in with cc of unresponsive spell.  Code stroke activated on the field.  DDX:  TIA vs. Seizure vs. Syncope  Stroke team already assessed, and recommend admission.  Family reports that pt has had UTI in the past where he fainted- UA ordered.   Review of Systems: As per HPI; otherwise 10 point review of systems reviewed and negative.   Ambulatory Status: ambulates with a walker, but that has declined a lot in a few months  Past Medical History:  Diagnosis Date  . Colon cancer ([place]) 2013   has  colostomy  . HOH (hard of hearing)   . UTI (urinary tract infection)     Past Surgical History:  Procedure Laterality Date  . COLON SURGERY     with colostomy  . HAND SURGERY Left    tendon repair    Social History   Social History  . Marital status: Married    Spouse name: N/A  . Number of children: N/A  . Years of education: N/A   Occupational History  . retired    Social History Main Topics  . Smoking status: Former Smoker    Types: Cigars  . Smokeless tobacco: Never Used     Comment: quit 30 years ago  . Alcohol use Yes     Comment: no for approx 20 yrs  . Drug use: No  . Sexual activity: Not on file   Other Topics Concern  . Not on file   Social History Narrative  . No narrative on file    No Known Allergies  No family history on file.  Prior to Admission medications   Medication Sig Start Date End Date Taking? Authorizing Provider  PRESCRIPTION MEDICATION Take 1 tablet by mouth every other day. Diuretic that starts with a "B"   Yes Historical Provider, MD    Physical Exam: Vitals:   05/20/16 0000 05/20/16 0015 05/20/16 0030  05/20/16 0045  BP: 146/75 130/80 124/70 133/68  Pulse: 94 95 92 92  Resp: 18 20 16 18   Temp:      TempSrc:      SpO2: 99% 100% 97% 96%  Weight:         General:  Appears calm and comfortable and is NAD Eyes:  PERRL, EOMI, normal lids, iris ENT:  Hard of hearing,normal lips & tongue, mmm Neck:  no LAD, masses or thyromegaly Cardiovascular:  RRR, no m/r/g. 2-3+ chronic LE edema.  Respiratory:  CTA bilaterally, no w/r/r. Normal respiratory effort. Abdomen:  soft, ntnd, NABS, with ostomy Skin:  no rash or induration seen on limited exam Musculoskeletal:  grossly normal tone BUE/BLE, good ROM, no bony abnormality Psychiatric:  grossly normal mood and affect, speech fluent and appropriate, AOx3 Neurologic:  CN 2-12 grossly intact, moves all extremities in coordinated fashion, sensation intact  Labs on Admission: I have  personally reviewed following labs and imaging studies  CBC:  Recent Labs Lab 05/19/16 2153 05/19/16 2215  WBC  --  6.6  NEUTROABS  --  4.8  HGB 13.3 13.1  HCT 39.0 40.7  MCV  --  92.7  PLT  --  123456   Basic Metabolic Panel:  Recent Labs Lab 05/19/16 2153 05/19/16 2215  NA 130* 137  K 6.6* 4.2  CL 104 101  CO2  --  25  GLUCOSE 108* 106*  BUN 20 12  CREATININE 0.90 0.99  CALCIUM  --  9.1   GFR: CrCl cannot be calculated (Unknown ideal weight.). Liver Function Tests:  Recent Labs Lab 05/19/16 2215  AST 38  ALT 28  ALKPHOS 54  BILITOT 0.5  PROT 6.3*  ALBUMIN 3.3*   No results for input(s): LIPASE, AMYLASE in the last 168 hours. No results for input(s): AMMONIA in the last 168 hours. Coagulation Profile:  Recent Labs Lab 05/19/16 2215  INR 1.01   Cardiac Enzymes: No results for input(s): CKTOTAL, CKMB, CKMBINDEX, TROPONINI in the last 168 hours. BNP (last 3 results) No results for input(s): PROBNP in the last 8760 hours. HbA1C: No results for input(s): HGBA1C in the last 72 hours. CBG:  Recent Labs Lab 05/19/16 2147  GLUCAP 93   Lipid Profile: No results for input(s): CHOL, HDL, LDLCALC, TRIG, CHOLHDL, LDLDIRECT in the last 72 hours. Thyroid Function Tests: No results for input(s): TSH, T4TOTAL, FREET4, T3FREE, THYROIDAB in the last 72 hours. Anemia Panel: No results for input(s): VITAMINB12, FOLATE, FERRITIN, TIBC, IRON, RETICCTPCT in the last 72 hours. Urine analysis:    Component Value Date/Time   COLORURINE YELLOW 05/20/2016 0010   APPEARANCEUR CLEAR 05/20/2016 0010   LABSPEC 1.015 05/20/2016 0010   PHURINE 5.5 05/20/2016 0010   GLUCOSEU NEGATIVE 05/20/2016 0010   HGBUR SMALL (A) 05/20/2016 0010   BILIRUBINUR NEGATIVE 05/20/2016 0010   KETONESUR NEGATIVE 05/20/2016 0010   PROTEINUR NEGATIVE 05/20/2016 0010   NITRITE NEGATIVE 05/20/2016 0010   LEUKOCYTESUR NEGATIVE 05/20/2016 0010    Creatinine Clearance: CrCl cannot be calculated  (Unknown ideal weight.).  Sepsis Labs: @LABRCNTIP (procalcitonin:4,lacticidven:4) )No results found for this or any previous visit (from the past 240 hour(s)).   Radiological Exams on Admission: Ct Head Code Stroke W/o Cm  Result Date: 05/19/2016 CLINICAL DATA:  Code stroke. Initial evaluation for acute syncope with left-sided weakness. Confusion. EXAM: CT HEAD WITHOUT CONTRAST TECHNIQUE: Contiguous axial images were obtained from the base of the skull through the vertex without intravenous contrast. COMPARISON:  None available. FINDINGS: Brain: Generalized  cerebral atrophy with moderate chronic microvascular ischemic disease. Small remote right cerebellar and right occipital lobe infarcts noted. Additional small remote left occipital lobe infarct noted. Remote lacunar infarcts within the bilateral basal ganglia. No acute intracranial hemorrhage. No evidence for acute large vessel territory infarct. No mass lesion, midline shift or mass effect. No hydrocephalus. No extra-axial fluid collection. Vascular: Intracranial atherosclerotic disease involving the carotid siphons and distal vertebral arteries noted. No hyperdense vessel. Skull: Scalp soft tissues demonstrate no acute abnormality. Calvarium intact. Sinuses/Orbits: Globes and orbital soft tissues within normal limits. Patient is status post lens extraction bilaterally. Chronic right maxillary sinusitis noted. Paranasal sinuses are otherwise clear. No mastoid effusion. ASPECTS [facility] Stroke Program Early CT Score) - Ganglionic level infarction (caudate, lentiform nuclei, internal capsule, insula, M1-M3 cortex): 7 - Supraganglionic infarction (M4-M6 cortex): 3 Total score (0-10 with 10 being normal): 10 IMPRESSION: 1. No acute intracranial infarct or other process identified. 2. ASPECTS is 10 3. Generalized age-related cerebral atrophy with chronic microvascular ischemic disease and multiple remote lacunar infarcts as above. 4. Chronic right maxillary  sinusitis. Critical Value/emergent results were called by telephone at the time of interpretation on 05/19/2016 at 10:10 pm to Dr. Cheral Marker, who verbally acknowledged these results. Electronically Signed   By: Jeannine Boga M.D.   On: 05/19/2016 22:11    EKG: Independently reviewed.  NSR with rate 85; nonspecific ST changes with no evidence of acute ischemia  Assessment/Plan Principal Problem:   Syncope and collapse Active Problems:   History of colon cancer   Hyperglycemia   Protein-calorie malnutrition, mild (HCC)   Syncope  -Etiology is not clear. The differential diagnosis is broad, including vasovagal syncope, seizure, TIA/stroke given right facial droop, arrhythmia, ACS (less likely, given no chest pain), alcohol intoxication, drug abuse, orthostatic status, carotid artery stenosis -By the [facility] syncope rule, the patient is at low risk for serious outcome but given his age it is reasonable to observe him overnight in the hospital. -Will monitor on telemetry -Orthostatic vital signs in AM -Trending troponins x3  -MRI-brain -2d echo -Carotid dopplers -Neuro checks  -check A1c, FLP, TSH -start ASA -PT/OT eval and treat -Family reports h/o similar presentation with prior UTIs, but UA is essentially normal so will pursue full syncope evaluation as per neuro recommendations.  Hypergylcemia -Glucose 106 -Not overly concerning, would not treat  Malnutrition -Albumin 3.3 -Nutrition consult  DVT prophylaxis: Lovenox Code Status:  Full - confirmed with patient/family Family Communication: Son and DIL present throughout evaluation Disposition Plan:  Home once clinically improved Consults called: Neuro  Admission status: It is my clinical opinion that referral for OBSERVATION is reasonable and necessary in this patient based on the above information provided. The aforementioned taken together are felt to place the patient at high risk for further clinical  deterioration. However it is anticipated that the patient may be medically stable for discharge from the hospital within 24 to 48 hours.     Karmen Bongo MD [facility]  If 7PM-7AM, please contact night-coverage [email or web address] Password [email or web address]  05/20/2016, 1:31 AM

## 2016-05-20 NOTE — Progress Notes (Signed)
PROGRESS NOTE        PATIENT DETAILS Name: Curtis Chavez Age: 82 y.o. Sex: male Date of Birth: [birth year] Admit Date: 05/19/2016 Admitting Physician Karmen Bongo, MD PCP:No PCP Per Patient  Brief Narrative: Patient is a 82 y.o. male with prior history of colon cancer status post colectomy-presented to the ED following a syncopal episode when he was taking a shower. See below for further details  Subjective: Very hard of hearing. Poor historian   Assessment/Plan: Principal Problem: Syncope and collapse: Suspected vasovagal etiology-telemetry negative so far. Echocardiogram shows preserved EF (65-70%). MRI brain negative for CVA. Carotid Doppler negative for significant stenosis. Awaiting EEG. Given advanced age, frailty-doubt further workup required at this time-likely will not change management.  Lower extremity edema: Appears chronic-UA negative for proteinuria, LFTs essentially normal-echo does demonstrate grade 1 diastolic dysfunction-resume Bumex.  History of colon cancer-status post colectomy: Follows with oncology in [place].  Other issues-here in [place] stay with son-plans are to go back to his daughter's house in [place] where he gets most of his care.  DVT Prophylaxis: Prophylactic Lovenox   Code Status: Full code  Family Communication: Son at bedside  Disposition Plan: Remain inpatient-but will plan on Home health on discharge  Antimicrobial agents: None  Procedures: None  CONSULTS:  neurology  Time spent: 25- minutes-Greater than 50% of this time was spent in counseling, explanation of diagnosis, planning of further management, and coordination of care.  MEDICATIONS: Anti-infectives    None      Scheduled Meds: . aspirin  81 mg Oral Daily  . enoxaparin (LOVENOX) injection  30 mg Subcutaneous Q24H  . [START ON 05/21/2016] pneumococcal 23 valent vaccine  0.5 mL Intramuscular Tomorrow-1000  . sodium chloride flush   3 mL Intravenous Q12H   Continuous Infusions: . lactated ringers 75 mL/hr at 05/20/16 0453   PRN Meds:.acetaminophen **OR** acetaminophen, morphine injection, ondansetron **OR** ondansetron (ZOFRAN) IV   PHYSICAL EXAM: Vital signs: Vitals:   05/20/16 0800 05/20/16 1000 05/20/16 1200 05/20/16 1400  BP: (!) 131/59 120/66 125/64 (!) 124/56  Pulse: 97 99 90 94  Resp: 16 20 18 20   Temp: 98.2 F (36.8 C) 97.7 F (36.5 C) 97.8 F (36.6 C) 97.2 F (36.2 C)  TempSrc: Oral Oral Oral Oral  SpO2: 96% 96% 97% 96%  Weight:      Height:       Filed Weights   05/19/16 2100 05/19/16 2215 05/20/16 0200  Weight: 82.7 kg (182 lb 5.1 oz) 82.7 kg (182 lb 5.1 oz) 80.1 kg (176 lb 9.6 oz)   Body mass index is 27.66 kg/m.   General appearance :Awake, alert, very hard of hearing. Eyes:, pupils equally reactive to light and accomodation HEENT: Atraumatic and Normocephalic Neck: supple, no JVD. No cervical lymphadenopathy. No thyromegaly Resp:Good air entry bilaterally, no added sounds  CVS: S1 S2 regular  GI: Bowel sounds present, Non tender and not distended with no gaurding, rigidity or rebound.No organomegaly Extremities: B/L Lower Ext shows ++edema, both legs are warm to touch Neurology:  speech clear,Non focal, sensation is grossly intact. Psychiatric: Normal judgment and insight.  Musculoskeletal:No digital cyanosis Skin:No Rash, warm and dry Wounds:N/A  I have personally reviewed following labs and imaging studies  LABORATORY DATA: CBC:  Recent Labs Lab 05/19/16 2153 05/19/16 2215 05/20/16 0655  WBC  --  6.6 6.1  NEUTROABS  --  4.8  --   HGB 13.3  13.3 13.1 11.9*  HCT 39.0  39.0 40.7 37.0*  MCV  --  92.7 94.1  PLT  --  196 [id number]    Basic Metabolic Panel:  Recent Labs Lab 05/19/16 2153 05/19/16 2215 05/20/16 0655  NA 130*  130* 137 137  K 6.6*  6.6* 4.2 4.0  CL 104  104 101 104  CO2  --  25 24  GLUCOSE 108*  108* 106* 109*  BUN 20  20 12 11   CREATININE 0.90   0.90 0.99 0.86  CALCIUM  --  9.1 8.9    GFR: Estimated Creatinine Clearance: 50.9 mL/min (by C-G formula based on SCr of 0.86 mg/dL).  Liver Function Tests:  Recent Labs Lab 05/19/16 2215  AST 38  ALT 28  ALKPHOS 54  BILITOT 0.5  PROT 6.3*  ALBUMIN 3.3*   No results for input(s): LIPASE, AMYLASE in the last 168 hours. No results for input(s): AMMONIA in the last 168 hours.  Coagulation Profile:  Recent Labs Lab 05/19/16 2215  INR 1.01    Cardiac Enzymes:  Recent Labs Lab 05/20/16 0308 05/20/16 0655 05/20/16 1315  TROPONINI <0.03 <0.03 <0.03    BNP (last 3 results) No results for input(s): PROBNP in the last 8760 hours.  HbA1C: No results for input(s): HGBA1C in the last 72 hours.  CBG:  Recent Labs Lab 05/19/16 2147  GLUCAP 93    Lipid Profile:  Recent Labs  05/20/16 0308  CHOL 144  HDL 51  LDLCALC 88  TRIG 27  CHOLHDL 2.8    Thyroid Function Tests:  Recent Labs  05/20/16 0655  TSH 1.561    Anemia Panel: No results for input(s): VITAMINB12, FOLATE, FERRITIN, TIBC, IRON, RETICCTPCT in the last 72 hours.  Urine analysis:    Component Value Date/Time   COLORURINE YELLOW 05/20/2016 0010   APPEARANCEUR CLEAR 05/20/2016 0010   LABSPEC 1.015 05/20/2016 0010   PHURINE 5.5 05/20/2016 0010   GLUCOSEU NEGATIVE 05/20/2016 0010   HGBUR SMALL (A) 05/20/2016 0010   BILIRUBINUR NEGATIVE 05/20/2016 0010   KETONESUR NEGATIVE 05/20/2016 0010   PROTEINUR NEGATIVE 05/20/2016 0010   NITRITE NEGATIVE 05/20/2016 0010   LEUKOCYTESUR NEGATIVE 05/20/2016 0010    Sepsis Labs: Lactic Acid, Venous No results found for: LATICACIDVEN  MICROBIOLOGY: No results found for this or any previous visit (from the past 240 hour(s)).  RADIOLOGY STUDIES/RESULTS: Mri Brain Without Contrast  Result Date: 05/20/2016 CLINICAL DATA:  Syncope.  History of colon cancer. EXAM: MRI HEAD WITHOUT CONTRAST TECHNIQUE: Multiplanar, multiecho pulse sequences of the  brain and surrounding structures were obtained without intravenous contrast. COMPARISON:  05/19/2016 CT of the head. FINDINGS: Brain: No diffusion signal abnormality. Patchy foci of T2 FLAIR hyperintense signal abnormality in subcortical and periventricular white matter is compatible with moderate chronic microvascular ischemic changes. There is moderate brain parenchymal volume loss. Few small foci of T2 FLAIR hyperintensity in the pons and encephalomalacia foci in the right cerebellar hemisphere compatible with chronic lacunar infarcts. No focal mass effect. No hydrocephalus. Single punctate focus of susceptibility hypointensity within the left thalamus probably represents hemosiderin deposition of old microhemorrhage. Otherwise no abnormal susceptibility hypointensity to indicate intracranial hemorrhage. Vascular: Normal flow voids. Skull and upper cervical spine: Normal marrow signal. Sinuses/Orbits: Mild ethmoid and maxillary sinus mucosal thickening and partial opacification of the right maxillary sinus. T1 shortening and susceptibility hypointensity of right maxillary sinus contents probably represent inspissation. No significant abnormal signal of the mastoid air  cells. Bilateral intra-ocular lens replacement. Other: None. IMPRESSION: 1. No acute intracranial abnormality. 2. Moderate chronic microvascular ischemic changes and parenchymal volume loss of the brain. 3. Paranasal sinus disease with right maxillary sinus opacification and chronic inflammatory changes. Electronically Signed   By: Kristine Garbe M.D.   On: 05/20/2016 04:38   Ct Head Code Stroke W/o Cm  Result Date: 05/19/2016 CLINICAL DATA:  Code stroke. Initial evaluation for acute syncope with left-sided weakness. Confusion. EXAM: CT HEAD WITHOUT CONTRAST TECHNIQUE: Contiguous axial images were obtained from the base of the skull through the vertex without intravenous contrast. COMPARISON:  None available. FINDINGS: Brain:  Generalized cerebral atrophy with moderate chronic microvascular ischemic disease. Small remote right cerebellar and right occipital lobe infarcts noted. Additional small remote left occipital lobe infarct noted. Remote lacunar infarcts within the bilateral basal ganglia. No acute intracranial hemorrhage. No evidence for acute large vessel territory infarct. No mass lesion, midline shift or mass effect. No hydrocephalus. No extra-axial fluid collection. Vascular: Intracranial atherosclerotic disease involving the carotid siphons and distal vertebral arteries noted. No hyperdense vessel. Skull: Scalp soft tissues demonstrate no acute abnormality. Calvarium intact. Sinuses/Orbits: Globes and orbital soft tissues within normal limits. Patient is status post lens extraction bilaterally. Chronic right maxillary sinusitis noted. Paranasal sinuses are otherwise clear. No mastoid effusion. ASPECTS [facility] Stroke Program Early CT Score) - Ganglionic level infarction (caudate, lentiform nuclei, internal capsule, insula, M1-M3 cortex): 7 - Supraganglionic infarction (M4-M6 cortex): 3 Total score (0-10 with 10 being normal): 10 IMPRESSION: 1. No acute intracranial infarct or other process identified. 2. ASPECTS is 10 3. Generalized age-related cerebral atrophy with chronic microvascular ischemic disease and multiple remote lacunar infarcts as above. 4. Chronic right maxillary sinusitis. Critical Value/emergent results were called by telephone at the time of interpretation on 05/19/2016 at 10:10 pm to Dr. Cheral Marker, who verbally acknowledged these results. Electronically Signed   By: Jeannine Boga M.D.   On: 05/19/2016 22:11     LOS: 0 days   Oren Binet, MD  [facility] Pager:[phone]  If 7PM-7AM, please contact night-coverage [email or web address] Password [facility] 05/20/2016, 3:57 PM

## 2016-05-20 NOTE — ED Notes (Signed)
Patient does have hearing aids but they are not with him - at home per son Curtis Chavez

## 2016-05-20 NOTE — Progress Notes (Signed)
STROKE TEAM PROGRESS NOTE   HISTORY OF PRESENT ILLNESS (per record) Curtis Chavez is an 82 y.o. male who presents for evaluation of possible stroke. He was taking a seated shower at home when family heard him fall onto his shower chair. Family went to check on him and he was unconscious. It took 5 minutes for him to awaken but continued to stare blankly for about 5 minutes. They called EMS. On EMS arrival, when asked to get up he could not stand up. After about 7-10 minutes he was able to get up with max assist. After 15 minutes at the scene EMS noted that he started to be able to talk again but speech output was jumbled and nonsensical, other than being able to tell EMS his son's name. CBG on scene was 143. Initial BP was 138/72. By the time of arrival to the ED, his speech had improved significantly. CT reveals no hemorrhage or acute changes. Diffuse atrophy and chronic small vessel ischemic changes are noted. He was LKW 20:45 on 05/19/2016. Patient was not administered IV t-PA secondary to due to NIHSS of 0. He was admitted for further evaluation and treatment.   SUBJECTIVE (INTERVAL HISTORY) Patient lying in the bed. States he had a similar episode 1 year ago  In [place]. Was admitted to [facility] at that time. He does not remember the etiology of the event.  No records found in care everywhere.   OBJECTIVE Temp:  [97.7 F (36.5 C)-98.6 F (37 C)] 97.7 F (36.5 C) (12/14 1000) Pulse Rate:  [83-102] 99 (12/14 1000) Cardiac Rhythm: Normal sinus rhythm (12/14 0702) Resp:  [12-26] 20 (12/14 1000) BP: (120-146)/(53-82) 120/66 (12/14 1000) SpO2:  [94 %-100 %] 96 % (12/14 1000) Weight:  [80.1 kg (176 lb 9.6 oz)-82.7 kg (182 lb 5.1 oz)] 80.1 kg (176 lb 9.6 oz) (12/14 0200)  CBC:   Recent Labs Lab 05/19/16 2215 05/20/16 0655  WBC 6.6 6.1  NEUTROABS 4.8  --   HGB 13.1 11.9*  HCT 40.7 37.0*  MCV 92.7 94.1  PLT 196 [id number]    Basic Metabolic Panel:   Recent Labs Lab 05/19/16 2215  05/20/16 0655  NA 137 137  K 4.2 4.0  CL 101 104  CO2 25 24  GLUCOSE 106* 109*  BUN 12 11  CREATININE 0.99 0.86  CALCIUM 9.1 8.9    Lipid Panel:     Component Value Date/Time   CHOL 144 05/20/2016 0308   TRIG 27 05/20/2016 0308   HDL 51 05/20/2016 0308   CHOLHDL 2.8 05/20/2016 0308   VLDL 5 05/20/2016 0308   LDLCALC 88 05/20/2016 0308   HgbA1c: No results found for: HGBA1C Urine Drug Screen:     Component Value Date/Time   LABOPIA NONE DETECTED 05/20/2016 0010   COCAINSCRNUR NONE DETECTED 05/20/2016 0010   LABBENZ NONE DETECTED 05/20/2016 0010   AMPHETMU NONE DETECTED 05/20/2016 0010   THCU NONE DETECTED 05/20/2016 0010   LABBARB NONE DETECTED 05/20/2016 0010      IMAGING  Mri Brain Without Contrast 05/20/2016 1. No acute intracranial abnormality. 2. Moderate chronic microvascular ischemic changes and parenchymal volume loss of the brain. 3. Paranasal sinus disease with right maxillary sinus opacification and chronic inflammatory changes.   Ct Head Code Stroke W/o Cm 05/19/2016 1. No acute intracranial infarct or other process identified. 2. ASPECTS is 10 3. Generalized age-related cerebral atrophy with chronic microvascular ischemic disease and multiple remote lacunar infarcts as above. 4. Chronic right maxillary sinusitis.  PHYSICAL EXAM Pleasant elderly caucasian male not in distress. . Afebrile. Head is nontraumatic. Neck is supple without bruit.    Cardiac exam no murmur or gallop. Lungs are clear to auscultation. Distal pulses are well felt. Neurological Exam ;  Awake  Alert oriented x 2.Diminished attention, registration and recall.. Normal speech and language.eye movements full without nystagmus.fundi were not visualized. Vision acuity and fields appear normal. Hearing is normal. Palatal movements are normal. Face symmetric. Tongue midline. Normal strength, tone, reflexes and coordination. Normal sensation. Gait deferred.  ASSESSMENT/PLAN [name]  is a 82 y.o. male with history of recent UTIs requiring hospitalization and h/o colon cancer s/p colostomy presenting with syncopal episode. He did not receive IV t-PA due to NIHSS =0.   Syncope, second episode   MRI  No acute stroke  MRA  Not ordered, no indicated  Carotid Doppler  pending   2D Echo  pending   Check EEG  LDL 88  HgbA1c pending  Lovenox 30 mg sq daily for VTE prophylaxis Diet NPO time specified Diet regular Room service appropriate? Yes; Fluid consistency: Thin  No antithrombotic prior to admission, now on aspirin 81 mg daily  Patient counseled to be compliant with his antithrombotic medications  Ongoing aggressive stroke risk factor management  Therapy recommendations:  pending   Disposition:  pending   Hyperglycemia   HgbA1c pending, goal < 7.0  Other Stroke Risk Factors  Advanced age  Overweight, Body mass index is 27.66 kg/m., recommend weight loss, diet and exercise as appropriate   Other Active Problems  Malnutrition   Hx colon cancer s/p colostomy  Hospital day # 0  Ronaldo Miyamoto [place] for Pager information 05/20/2016 11:02 AM  I have personally examined this patient, reviewed notes, independently viewed imaging studies, participated in medical decision making and plan of care.ROS completed by me personally and pertinent positives fully documented  I have made any additions or clarifications directly to the above note. Agree with note above. Recommend check EEG and carotid ultrasound study. Greater than 50% time during this 35 minute visit was spent on counseling and coordination of care about his episode of loss of consciousness, seizure,stroke and TIA risk and answered questions  Antony Contras, MD Medical Director [place] Pager: [phone] 05/20/2016 12:37 PM  To contact Stroke Continuity provider, please refer to [email or web address] After hours, contact General Neurology

## 2016-05-20 NOTE — Progress Notes (Signed)
PT Cancellation Note  Patient Details Name: Curtis Chavez MRN: [id number] DOB: December 29, 1917   Cancelled Treatment:    Reason Eval/Treat Not Completed: Patient at procedure or test/unavailable Pt currently out of room   [employer] 05/20/2016, 2:09 PM Pager [phone]

## 2016-05-21 DIAGNOSIS — R55 Syncope and collapse: Secondary | ICD-10-CM | POA: Diagnosis not present

## 2016-05-21 DIAGNOSIS — Z85038 Personal history of other malignant neoplasm of large intestine: Secondary | ICD-10-CM | POA: Diagnosis not present

## 2016-05-21 LAB — HEMOGLOBIN A1C
Hgb A1c MFr Bld: 5.1 % (ref 4.8–5.6)
Mean Plasma Glucose: 100 mg/dL

## 2016-05-21 MED ORDER — ASPIRIN 81 MG PO CHEW: 81.0000 mg | CHEWABLE_TABLET | Freq: Every day | ORAL | Status: DC

## 2016-05-21 NOTE — Progress Notes (Signed)
STROKE TEAM PROGRESS NOTE   HISTORY OF PRESENT ILLNESS (per record) Curtis Chavez is an 82 y.o. male who presents for evaluation of possible stroke. He was taking a seated shower at home when family heard him fall onto his shower chair. Family went to check on him and he was unconscious. It took 5 minutes for him to awaken but continued to stare blankly for about 5 minutes. They called EMS. On EMS arrival, when asked to get up he could not stand up. After about 7-10 minutes he was able to get up with max assist. After 15 minutes at the scene EMS noted that he started to be able to talk again but speech output was jumbled and nonsensical, other than being able to tell EMS his son's name. CBG on scene was 143. Initial BP was 138/72. By the time of arrival to the ED, his speech had improved significantly. CT reveals no hemorrhage or acute changes. Diffuse atrophy and chronic small vessel ischemic changes are noted. He was LKW 20:45 on 05/19/2016. Patient was not administered IV t-PA secondary to due to NIHSS of 0. He was admitted for further evaluation and treatment.   SUBJECTIVE (INTERVAL HISTORY) Patient lying in the bed. Son at bedside. No complaints today OBJECTIVE Temp:  [97.2 F (36.2 C)-98.2 F (36.8 C)] 97.8 F (36.6 C) (12/15 1306) Pulse Rate:  [85-97] 89 (12/15 1306) Cardiac Rhythm: Normal sinus rhythm (12/15 0700) Resp:  [16-20] 16 (12/15 1306) BP: (116-138)/(55-69) 138/67 (12/15 1306) SpO2:  [92 %-96 %] 94 % (12/15 1306)  CBC:   Recent Labs Lab 05/19/16 2215 05/20/16 0655  WBC 6.6 6.1  NEUTROABS 4.8  --   HGB 13.1 11.9*  HCT 40.7 37.0*  MCV 92.7 94.1  PLT 196 [id number]    Basic Metabolic Panel:   Recent Labs Lab 05/19/16 2215 05/20/16 0655  NA 137 137  K 4.2 4.0  CL 101 104  CO2 25 24  GLUCOSE 106* 109*  BUN 12 11  CREATININE 0.99 0.86  CALCIUM 9.1 8.9    Lipid Panel:     Component Value Date/Time   CHOL 144 05/20/2016 0308   TRIG 27 05/20/2016 0308   HDL 51  05/20/2016 0308   CHOLHDL 2.8 05/20/2016 0308   VLDL 5 05/20/2016 0308   LDLCALC 88 05/20/2016 0308   HgbA1c:  Lab Results  Component Value Date   HGBA1C 5.1 05/20/2016   Urine Drug Screen:     Component Value Date/Time   LABOPIA NONE DETECTED 05/20/2016 0010   COCAINSCRNUR NONE DETECTED 05/20/2016 0010   LABBENZ NONE DETECTED 05/20/2016 0010   AMPHETMU NONE DETECTED 05/20/2016 0010   THCU NONE DETECTED 05/20/2016 0010   LABBARB NONE DETECTED 05/20/2016 0010      IMAGING  Mri Brain Without Contrast 05/20/2016 1. No acute intracranial abnormality. 2. Moderate chronic microvascular ischemic changes and parenchymal volume loss of the brain. 3. Paranasal sinus disease with right maxillary sinus opacification and chronic inflammatory changes.   Ct Head Code Stroke W/o Cm 05/19/2016 1. No acute intracranial infarct or other process identified. 2. ASPECTS is 10 3. Generalized age-related cerebral atrophy with chronic microvascular ischemic disease and multiple remote lacunar infarcts as above. 4. Chronic right maxillary sinusitis.    PHYSICAL EXAM Pleasant elderly caucasian male not in distress. . Afebrile. Head is nontraumatic. Neck is supple without bruit.    Cardiac exam no murmur or gallop. Lungs are clear to auscultation. Distal pulses are well felt. Neurological Exam ;  Awake  Alert oriented x 2.Diminished attention, registration and recall.. Normal speech and language.eye movements full without nystagmus.fundi were not visualized. Vision acuity and fields appear normal. Hearing is normal. Palatal movements are normal. Face symmetric. Tongue midline. Normal strength, tone, reflexes and coordination. Normal sensation. Gait deferred.  ASSESSMENT/PLAN [name] is a 82 y.o. male with history of recent UTIs requiring hospitalization and h/o colon cancer s/p colostomy presenting with syncopal episode. He did not receive IV t-PA due to NIHSS =0.   Syncope, second episode    MRI  No acute stroke  MRA  Not ordered, no indicated Carotid Doppler  Bilateral: mild mixed plaque origin ICA. 1-39% ICA plaquing.  Vertebral artery flow is antegrade 2D Echo  Left ventricle: The cavity size was normal. There was mild   concentric hypertrophy. Systolic function was vigorous. The   estimated ejection fraction was in the range of 65% to 70%. Wall   motion was normal; there were no regional wall motion    abnormalities  EEG normal  LDL 88  HgbA1c  5.1  Lovenox 30 mg sq daily for VTE prophylaxis Diet regular Room service appropriate? Yes; Fluid consistency: Thin Diet - low sodium heart healthy  No antithrombotic prior to admission, now on aspirin 81 mg daily  Patient counseled to be compliant with his antithrombotic medications  Ongoing aggressive stroke risk factor management  Therapy recommendations:  Home health Disposition:  home Hyperglycemia   HgbA1c pending, goal < 7.0  Other Stroke Risk Factors  Advanced age  Overweight, Body mass index is 27.66 kg/m., recommend weight loss, diet and exercise as appropriate   Other Active Problems  Malnutrition   Hx colon cancer s/p colostomy  Hospital day # 0    I have personally examined this patient, reviewed notes, independently viewed imaging studies, participated in medical decision making and plan of care.ROS completed by me personally and pertinent positives fully documented  I have made any additions or clarifications directly to the above note. DC home today. D/W son and answered questions. Greater than 50% time during this 25 minute visit was spent on counseling and coordination of care about his episode of loss of consciousness, seizure,stroke and TIA risk and answered questions  Curtis Contras, MD Medical Director [facility] Pager: [phone] 05/21/2016 1:19 PM  To contact Stroke Continuity provider, please refer to [email or web address] After hours, contact General Neurology

## 2016-05-21 NOTE — Evaluation (Signed)
Physical Therapy Evaluation Patient Details Name: Curtis Chavez MRN: [id number] DOB: Dec 24, 1917 Today's Date: 05/21/2016   History of Present Illness  82 y.o. male with medical history significant of 2 recent UTIs requiring hospitalization and h/o colon cancer s/p colostomy presenting with syncopal episode with confusion and word finding problems. s/p Code Stroke testing (all negative, including MRI)    Clinical Impression  Patient evaluated by Physical Therapy with no further acute PT needs identified. Lengthy discussion with patient and son re: declining mobility and cognition (Patient reports, "I'm 84, what do you expect?"). Son interested in hiring a nursing aide for several hours per day while he works. Will make Case Manager aware. MD in and informed pt/son that pt will be discharged home today. PT is signing off. Thank you for this referral.     Follow Up Recommendations Home health PT;Supervision/Assistance - 24 hour (?arrange for [place]-son unsure where pt will be after 25th)    Equipment Recommendations  None recommended by PT    Recommendations for Other Services       Precautions / Restrictions Precautions Precautions: Fall Precaution Comments: son denies h/o falls      Mobility  Bed Mobility Overal bed mobility: Needs Assistance Bed Mobility: Supine to Sit     Supine to sit: Supervision     General bed mobility comments: HOB flat, no rail; pt uses legs to create momentum and required 2 attempts to slowly get to sitting  Transfers Overall transfer level: Needs assistance Equipment used: Rolling walker (2 wheeled) Transfers: Sit to/from Stand Sit to Stand: Min guard         General transfer comment: pt likes to put one hand on center of RW, yet he pulls on RW (tipping it) instead of pushing down; minguard for safety and vc  Ambulation/Gait Ambulation/Gait assistance: Supervision Ambulation Distance (Feet): 220 Feet Assistive device: Rolling walker (2  wheeled) Gait Pattern/deviations: Step-through pattern;Decreased stride length;Trunk flexed Gait velocity: decr   General Gait Details: RW slightly too far ahead; able to turn head and talk while walking with no imbalance  Stairs            Wheelchair Mobility    Modified Rankin (Stroke Patients Only)       Balance Overall balance assessment: Needs assistance Sitting-balance support: No upper extremity supported;Feet supported Sitting balance-Leahy Scale: Fair Sitting balance - Comments: does not feel safe reaching down to try to don socks     Standing balance-Leahy Scale: Poor Standing balance comment: seeks bil UE support                             Pertinent Vitals/Pain Pain Assessment: No/denies pain    Home Living Family/patient expects to be discharged to:: Private residence Living Arrangements: Children (living w/ son x 1week and moving to his dtr's?) Available Help at Discharge: Family;Available PRN/intermittently (son and dtr in law (both work); pt alone ~10A-2PM) Type of Home: House Home Access: Stairs to enter Entrance Stairs-Rails: None Technical brewer of Steps: 1 Home Layout: One level Home Equipment: Environmental consultant - 2 wheels;Shower seat Additional Comments: has medical alert necklace at son's; pt normally lives with dtr in [place] and she provides 24/7 care; she hurt her back and pt has been in [place] with son since Oct    Prior Function Level of Independence: Needs assistance   Gait / Transfers Assistance Needed: per son in Sept was walking with/without cane; progressive decline to now using  RW full-time  ADL's / Homemaking Assistance Needed: per son, in Sept was showering and dressing with supervision only; currently requires physical assist in/out shower and with all aspects of dressing        Hand Dominance   Dominant Hand: Left    Extremity/Trunk Assessment   Upper Extremity Assessment Upper Extremity Assessment: Defer  to OT evaluation LUE Deficits / Details: able to grasp and maneuver RW appropriately    Lower Extremity Assessment Lower Extremity Assessment: RLE deficits/detail;LLE deficits/detail RLE Deficits / Details: AROM WFL; knee extension 4/5; ankle DF 5/5 RLE Sensation:  (denies changes) LLE Deficits / Details: AROM WFL; knee extension 4/5; ankle DF 5/5 LLE Sensation:  (denies changes)    Cervical / Trunk Assessment Cervical / Trunk Assessment: Other exceptions Cervical / Trunk Exceptions: forward head  Communication   Communication: HOH  Cognition Arousal/Alertness: Awake/alert Behavior During Therapy: WFL for tasks assessed/performed Overall Cognitive Status: History of cognitive impairments - at baseline (baseline has declined Sept to Dec (memory deficits) per son)                 General Comments: repeated comments re: colostomy issues x 3 to this therapist and x 2 to his nurse    General Comments General comments (skin integrity, edema, etc.): Long discussion with son re: pt's living situation and need for 24/7 care (cause of this episode remains unknown). Son is in/out during day estimating pt alone 10AM-2PM and is interested in information re: nursing aide/sitter    Exercises     Assessment/Plan    PT Assessment All further PT needs can be met in the next venue of care  PT Problem List Decreased strength;Decreased balance;Decreased mobility;Decreased cognition;Decreased knowledge of use of DME;Decreased safety awareness          PT Treatment Interventions      PT Goals (Current goals can be found in the Care Plan section)  Acute Rehab PT Goals Patient Stated Goal: not fall PT Goal Formulation: All assessment and education complete, DC therapy    Frequency     Barriers to discharge        Co-evaluation               End of Session Equipment Utilized During Treatment: Gait belt Activity Tolerance: Patient tolerated treatment well Patient left: in  chair;with call bell/phone within reach;with chair alarm set Nurse Communication: Mobility status    Functional Assessment Tool Used: clinical judgement Functional Limitation: Mobility: Walking and moving around Mobility: Walking and Moving Around Current Status ([id number]): At least 1 percent but less than 20 percent impaired, limited or restricted Mobility: Walking and Moving Around Goal Status [phone]): At least 1 percent but less than 20 percent impaired, limited or restricted Mobility: Walking and Moving Around Discharge Status [phone]): At least 1 percent but less than 20 percent impaired, limited or restricted    Time: [phone] PT Time Calculation (min) (ACUTE ONLY): 43 min   Charges:   PT Evaluation $PT Eval Moderate Complexity: 1 Procedure PT Treatments $Gait Training: 8-22 mins $Self Care/Home Management: 8-22   PT G Codes:   PT G-Codes **NOT FOR INPATIENT CLASS** Functional Assessment Tool Used: clinical judgement Functional Limitation: Mobility: Walking and moving around Mobility: Walking and Moving Around Current Status (B3532): At least 1 percent but less than 20 percent impaired, limited or restricted Mobility: Walking and Moving Around Goal Status [phone]): At least 1 percent but less than 20 percent impaired, limited or restricted Mobility: Walking and Moving  Around Discharge Status [phone]): At least 1 percent but less than 20 percent impaired, limited or restricted    Rexanne Mano 05/21/2016, 9:57 AM Pager [phone]

## 2016-05-21 NOTE — Progress Notes (Signed)
Occupational Therapy Treatment Patient Details Name: Curtis Chavez MRN: [id number] DOB: [birth year] Today's Date: 05/21/2016    History of present illness 82 y.o. male with medical history significant of 2 recent UTIs requiring hospitalization and h/o colon cancer s/p colostomy presenting with syncopal episode with confusion and word finding problems. s/p Code Stroke testing (all negative, including MRI)   OT comments  Pt progressing towards acute OT goals. Focus of session was in-room functional mobility and transfers. Pt's son present during session and included in education regarding equipment recommendation (bed rail) and fall prevention. D/c plan remains appropriate.    Follow Up Recommendations  Home health OT;Supervision/Assistance - 24 hour    Equipment Recommendations  None recommended by OT    Recommendations for Other Services      Precautions / Restrictions Precautions Precautions: Fall Precaution Comments: son denies h/o falls Restrictions Weight Bearing Restrictions: No       Mobility Bed Mobility Overal bed mobility: Needs Assistance Bed Mobility: Supine to Sit;Sit to Supine     Supine to sit: Supervision Sit to supine: Supervision   General bed mobility comments: HOB flat, momentum and rail used. Discussed getting a bed rail with pt's son.  Transfers Overall transfer level: Needs assistance Equipment used: Rolling walker (2 wheeled) Transfers: Sit to/from Stand Sit to Stand: Min guard         General transfer comment: min guard for safety with pt using BUE support on rw during stand>sit and using 1 hand on center of rw to stand despite verbal cues.     Balance Overall balance assessment: Needs assistance Sitting-balance support: No upper extremity supported;Feet supported Sitting balance-Leahy Scale: Fair     Standing balance support: Bilateral upper extremity supported;During functional activity Standing balance-Leahy Scale: Poor                      ADL Overall ADL's : Needs assistance/impaired     Grooming: Min guard;Standing                   Toilet Transfer: Min guard;Ambulation;RW Toilet Transfer Details (indicate cue type and reason): simulated with in-room functional mobility and transfer to EOB         Functional mobility during ADLs: Min guard;Rolling walker General ADL Comments: Pt completed bed mobility, in-room functional mobility, and grooming tasks at sink. Pt declined toilet transfer. Son present this session and included in education regarding equipment needs, fall prevention and energy conservation strategies.       Vision                     Perception     Praxis      Cognition   Behavior During Therapy: WFL for tasks assessed/performed Overall Cognitive Status: History of cognitive impairments - at baseline                       Extremity/Trunk Assessment               Exercises     Shoulder Instructions       General Comments      Pertinent Vitals/ Pain       Pain Assessment: Faces Faces Pain Scale: No hurt  Home Living  Prior Functioning/Environment              Frequency  Min 2X/week        Progress Toward Goals  OT Goals(current goals can now be found in the care plan section)  Progress towards OT goals: Progressing toward goals  Acute Rehab OT Goals Patient Stated Goal: not fall OT Goal Formulation: With patient Time For Goal Achievement: 05/27/16 Potential to Achieve Goals: Good ADL Goals Pt Will Perform Lower Body Bathing: sit to/from stand;with supervision Pt Will Perform Lower Body Dressing: with supervision;sit to/from stand Pt Will Transfer to Toilet: with supervision;ambulating Pt Will Perform Toileting - Clothing Manipulation and hygiene: with supervision;sit to/from stand Pt Will Perform Tub/Shower Transfer: with min guard assist;ambulating;rolling  walker  Plan Discharge plan remains appropriate    Co-evaluation                 End of Session Equipment Utilized During Treatment: Gait belt;Rolling walker   Activity Tolerance Patient tolerated treatment well   Patient Left in bed;with call bell/phone within reach;with bed alarm set;with family/visitor present   Nurse Communication      Functional Assessment Tool Used: clinical judgement Functional Limitation: Self care Self Care Current Status [id number]): At least 1 percent but less than 20 percent impaired, limited or restricted Self Care Goal Status [id number]): At least 1 percent but less than 20 percent impaired, limited or restricted   Time: 1315-1330 OT Time Calculation (min): 15 min  Charges: OT G-codes **NOT FOR INPATIENT CLASS** Functional Assessment Tool Used: clinical judgement Functional Limitation: Self care Self Care Current Status [id number]): At least 1 percent but less than 20 percent impaired, limited or restricted Self Care Goal Status [id number]): At least 1 percent but less than 20 percent impaired, limited or restricted OT General Charges $OT Visit: 1 Procedure OT Treatments $Self Care/Home Management : 8-22 mins  Hortencia Pilar 05/21/2016, 2:09 PM

## 2016-05-21 NOTE — Progress Notes (Signed)
Patient is discharged from room 5C04 at this time. Alert and in stable condition. IV site d/c'd as well as tele. Instructions read to patient and son with understanding verbalized. Left unit via wheelchair with all belongings at side.

## 2016-05-21 NOTE — Progress Notes (Addendum)
nse called. Son had req sitter priv duty list in case he wants to hire addit help. Son not in room but left sitter private duty list. Await decision on hhc agency. Will cont to follow. Met w son. Pt going to his home today. He has chosen [employer] [facility] [employer] pt and ot. Alerted donna w [facility] of new hhc orders. Son plans to call several agncies and line up addit help starting Monday when he returns to work. Pt for dc home today.

## 2016-05-21 NOTE — Discharge Summary (Signed)
PATIENT DETAILS Name: Curtis Chavez Age: 82 y.o. Sex: male Date of Birth: 08/15/1917 MRN: [id number]. Admitting Physician: Karmen Bongo, MD PCP:No PCP Per Patient  Admit Date: 05/19/2016 Discharge date: 05/21/2016  Recommendations for Outpatient Follow-up:  1. Follow up with PCP in 1-2 weeks 2. Please obtain BMP/CBC in one week   Admitted From:  Home  Disposition: Home with home health services   [place]: Yes  Equipment/Devices: None  Discharge Condition: Stable  CODE STATUS: FULL CODE  Diet recommendation:  Heart Healthy   Brief Summary: See H&P, Labs, Consult and Test reports for all details in brief, Patient is a 82 y.o. male with prior history of colon cancer status post colectomy-presented to the ED following a syncopal episode when he was taking a shower. See below for further details  Brief Hospital Course: Syncope and collapse: Suspected vasovagal etiology-telemetry negative . Echocardiogram shows preserved EF (65-70%). MRI brain negative for CVA. Carotid Doppler negative for significant stenosis. EEG brain negative for seizure activity. Given advanced age, frailty-doubt further workup required at this time-likely will not change management. Schedule with Dr. Richrd Sox further recommendations.  Lower extremity edema: Appears chronic-UA negative for proteinuria, LFTs essentially normal-echo does demonstrate grade 1 diastolic dysfunction-resume Bumex.  History of colon cancer-status post colectomy: Follows with oncology in [place].  Other issues-here in [place] stay with son-plans are to go back to his daughter's house in [place] where he gets most of his care  Procedures None  Discharge Diagnoses:  Principal Problem:   Syncope and collapse Active Problems:   History of colon cancer   Hyperglycemia   Protein-calorie malnutrition, mild ([place])   Discharge Instructions:  Activity:  As tolerated with Full fall precautions use walker/cane &  assistance as needed   Discharge Instructions    Diet - low sodium heart healthy    Complete by:  As directed    Discharge instructions    Complete by:  As directed    Follow with Primary MD   Please get a complete blood count and chemistry panel checked by your Primary MD at your next visit, and again as instructed by your Primary MD.  Get Medicines reviewed and adjusted: Please take all your medications with you for your next visit with your Primary MD  Laboratory/radiological data: Please request your Primary MD to go over all hospital tests and procedure/radiological results at the follow up, please ask your Primary MD to get all Hospital records sent to his/her office.  In some cases, they will be blood work, cultures and biopsy results pending at the time of your discharge. Please request that your primary care M.D. follows up on these results.  Also Note the following: If you experience worsening of your admission symptoms, develop shortness of breath, life threatening emergency, suicidal or homicidal thoughts you must seek medical attention immediately by calling 911 or calling your MD immediately  if symptoms less severe.  You must read complete instructions/literature along with all the possible adverse reactions/side effects for all the Medicines you take and that have been prescribed to you. Take any new Medicines after you have completely understood and accpet all the possible adverse reactions/side effects.   Do not drive when taking Pain medications or sleeping medications (Benzodaizepines)  Do not take more than prescribed Pain, Sleep and Anxiety Medications. It is not advisable to combine anxiety,sleep and pain medications without talking with your primary care practitioner  Special Instructions: If you have smoked or chewed Tobacco  in the last  2 yrs please stop smoking, stop any regular Alcohol  and or any Recreational drug use.  Wear Seat belts while  driving.  Please note: You were cared for by a hospitalist during your hospital stay. Once you are discharged, your primary care physician will handle any further medical issues. Please note that NO REFILLS for any discharge medications will be authorized once you are discharged, as it is imperative that you return to your primary care physician (or establish a relationship with a primary care physician if you do not have one) for your post hospital discharge needs so that they can reassess your need for medications and monitor your lab values.   Increase activity slowly    Complete by:  As directed      Allergies as of 05/21/2016   No Known Allergies     Medication List    TAKE these medications   aspirin 81 MG chewable tablet Chew 1 tablet (81 mg total) by mouth daily. Start taking on:  05/22/2016   bumetanide 0.5 MG tablet Commonly known as:  BUMEX Take 0.5 mg by mouth every other day.      Follow-up Information    Primary M.D. in [place]. Schedule an appointment as soon as possible for a visit.   Why:  In the next 2 weeks.         No Known Allergies  Consultations:   neurology  Other Procedures/Studies: Mri Brain Without Contrast  Result Date: 05/20/2016 CLINICAL DATA:  Syncope.  History of colon cancer. EXAM: MRI HEAD WITHOUT CONTRAST TECHNIQUE: Multiplanar, multiecho pulse sequences of the brain and surrounding structures were obtained without intravenous contrast. COMPARISON:  05/19/2016 CT of the head. FINDINGS: Brain: No diffusion signal abnormality. Patchy foci of T2 FLAIR hyperintense signal abnormality in subcortical and periventricular white matter is compatible with moderate chronic microvascular ischemic changes. There is moderate brain parenchymal volume loss. Few small foci of T2 FLAIR hyperintensity in the pons and encephalomalacia foci in the right cerebellar hemisphere compatible with chronic lacunar infarcts. No focal mass effect. No hydrocephalus.  Single punctate focus of susceptibility hypointensity within the left thalamus probably represents hemosiderin deposition of old microhemorrhage. Otherwise no abnormal susceptibility hypointensity to indicate intracranial hemorrhage. Vascular: Normal flow voids. Skull and upper cervical spine: Normal marrow signal. Sinuses/Orbits: Mild ethmoid and maxillary sinus mucosal thickening and partial opacification of the right maxillary sinus. T1 shortening and susceptibility hypointensity of right maxillary sinus contents probably represent inspissation. No significant abnormal signal of the mastoid air cells. Bilateral intra-ocular lens replacement. Other: None. IMPRESSION: 1. No acute intracranial abnormality. 2. Moderate chronic microvascular ischemic changes and parenchymal volume loss of the brain. 3. Paranasal sinus disease with right maxillary sinus opacification and chronic inflammatory changes. Electronically Signed   By: Kristine Garbe M.D.   On: 05/20/2016 04:38   Ct Head Code Stroke W/o Cm  Result Date: 05/19/2016 CLINICAL DATA:  Code stroke. Initial evaluation for acute syncope with left-sided weakness. Confusion. EXAM: CT HEAD WITHOUT CONTRAST TECHNIQUE: Contiguous axial images were obtained from the base of the skull through the vertex without intravenous contrast. COMPARISON:  None available. FINDINGS: Brain: Generalized cerebral atrophy with moderate chronic microvascular ischemic disease. Small remote right cerebellar and right occipital lobe infarcts noted. Additional small remote left occipital lobe infarct noted. Remote lacunar infarcts within the bilateral basal ganglia. No acute intracranial hemorrhage. No evidence for acute large vessel territory infarct. No mass lesion, midline shift or mass effect. No hydrocephalus. No extra-axial fluid collection. Vascular:  Intracranial atherosclerotic disease involving the carotid siphons and distal vertebral arteries noted. No hyperdense vessel.  Skull: Scalp soft tissues demonstrate no acute abnormality. Calvarium intact. Sinuses/Orbits: Globes and orbital soft tissues within normal limits. Patient is status post lens extraction bilaterally. Chronic right maxillary sinusitis noted. Paranasal sinuses are otherwise clear. No mastoid effusion. ASPECTS [facility] Stroke Program Early CT Score) - Ganglionic level infarction (caudate, lentiform nuclei, internal capsule, insula, M1-M3 cortex): 7 - Supraganglionic infarction (M4-M6 cortex): 3 Total score (0-10 with 10 being normal): 10 IMPRESSION: 1. No acute intracranial infarct or other process identified. 2. ASPECTS is 10 3. Generalized age-related cerebral atrophy with chronic microvascular ischemic disease and multiple remote lacunar infarcts as above. 4. Chronic right maxillary sinusitis. Critical Value/emergent results were called by telephone at the time of interpretation on 05/19/2016 at 10:10 pm to Dr. Cheral Marker, who verbally acknowledged these results. Electronically Signed   By: Jeannine Boga M.D.   On: 05/19/2016 22:11      TODAY-DAY OF DISCHARGE:  Subjective:   Curtis Chavez today has no headache,no chest abdominal pain,no new weakness tingling or numbness, feels much better wants to go home today.   Objective:   Blood pressure (!) 137/56, pulse 97, temperature 97.9 F (36.6 C), temperature source Oral, resp. rate 18, height 5\' 7"  (1.702 m), weight 80.1 kg (176 lb 9.6 oz), SpO2 95 %.  Intake/Output Summary (Last 24 hours) at 05/21/16 1255 Last data filed at 05/21/16 1100  Gross per 24 hour  Intake            697.5 ml  Output              600 ml  Net             97.5 ml   Filed Weights   05/19/16 2100 05/19/16 2215 05/20/16 0200  Weight: 82.7 kg (182 lb 5.1 oz) 82.7 kg (182 lb 5.1 oz) 80.1 kg (176 lb 9.6 oz)    Exam: Awake Alert, Oriented *3, No new F.N deficits, Normal affect [place].AT,PERRAL Supple Neck,No JVD, No cervical lymphadenopathy appriciated.  Symmetrical Chest  wall movement, Good air movement bilaterally, CTAB RRR,No Gallops,Rubs or new Murmurs, No Parasternal Heave +ve B.Sounds, Abd Soft, Non tender, No organomegaly appriciated, No rebound -guarding or rigidity. No Cyanosis, Clubbing or edema, No new Rash or bruise   PERTINENT RADIOLOGIC STUDIES: Mri Brain Without Contrast  Result Date: 05/20/2016 CLINICAL DATA:  Syncope.  History of colon cancer. EXAM: MRI HEAD WITHOUT CONTRAST TECHNIQUE: Multiplanar, multiecho pulse sequences of the brain and surrounding structures were obtained without intravenous contrast. COMPARISON:  05/19/2016 CT of the head. FINDINGS: Brain: No diffusion signal abnormality. Patchy foci of T2 FLAIR hyperintense signal abnormality in subcortical and periventricular white matter is compatible with moderate chronic microvascular ischemic changes. There is moderate brain parenchymal volume loss. Few small foci of T2 FLAIR hyperintensity in the pons and encephalomalacia foci in the right cerebellar hemisphere compatible with chronic lacunar infarcts. No focal mass effect. No hydrocephalus. Single punctate focus of susceptibility hypointensity within the left thalamus probably represents hemosiderin deposition of old microhemorrhage. Otherwise no abnormal susceptibility hypointensity to indicate intracranial hemorrhage. Vascular: Normal flow voids. Skull and upper cervical spine: Normal marrow signal. Sinuses/Orbits: Mild ethmoid and maxillary sinus mucosal thickening and partial opacification of the right maxillary sinus. T1 shortening and susceptibility hypointensity of right maxillary sinus contents probably represent inspissation. No significant abnormal signal of the mastoid air cells. Bilateral intra-ocular lens replacement. Other: None. IMPRESSION: 1. No acute  intracranial abnormality. 2. Moderate chronic microvascular ischemic changes and parenchymal volume loss of the brain. 3. Paranasal sinus disease with right maxillary sinus  opacification and chronic inflammatory changes. Electronically Signed   By: Kristine Garbe M.D.   On: 05/20/2016 04:38   Ct Head Code Stroke W/o Cm  Result Date: 05/19/2016 CLINICAL DATA:  Code stroke. Initial evaluation for acute syncope with left-sided weakness. Confusion. EXAM: CT HEAD WITHOUT CONTRAST TECHNIQUE: Contiguous axial images were obtained from the base of the skull through the vertex without intravenous contrast. COMPARISON:  None available. FINDINGS: Brain: Generalized cerebral atrophy with moderate chronic microvascular ischemic disease. Small remote right cerebellar and right occipital lobe infarcts noted. Additional small remote left occipital lobe infarct noted. Remote lacunar infarcts within the bilateral basal ganglia. No acute intracranial hemorrhage. No evidence for acute large vessel territory infarct. No mass lesion, midline shift or mass effect. No hydrocephalus. No extra-axial fluid collection. Vascular: Intracranial atherosclerotic disease involving the carotid siphons and distal vertebral arteries noted. No hyperdense vessel. Skull: Scalp soft tissues demonstrate no acute abnormality. Calvarium intact. Sinuses/Orbits: Globes and orbital soft tissues within normal limits. Patient is status post lens extraction bilaterally. Chronic right maxillary sinusitis noted. Paranasal sinuses are otherwise clear. No mastoid effusion. ASPECTS [facility] Stroke Program Early CT Score) - Ganglionic level infarction (caudate, lentiform nuclei, internal capsule, insula, M1-M3 cortex): 7 - Supraganglionic infarction (M4-M6 cortex): 3 Total score (0-10 with 10 being normal): 10 IMPRESSION: 1. No acute intracranial infarct or other process identified. 2. ASPECTS is 10 3. Generalized age-related cerebral atrophy with chronic microvascular ischemic disease and multiple remote lacunar infarcts as above. 4. Chronic right maxillary sinusitis. Critical Value/emergent results were called by telephone  at the time of interpretation on 05/19/2016 at 10:10 pm to Dr. Cheral Marker, who verbally acknowledged these results. Electronically Signed   By: Jeannine Boga M.D.   On: 05/19/2016 22:11     PERTINENT LAB RESULTS: CBC:  Recent Labs  05/19/16 2215 05/20/16 0655  WBC 6.6 6.1  HGB 13.1 11.9*  HCT 40.7 37.0*  PLT 196 228   CMET CMP     Component Value Date/Time   NA 137 05/20/2016 0655   K 4.0 05/20/2016 0655   CL 104 05/20/2016 0655   CO2 24 05/20/2016 0655   GLUCOSE 109 (H) 05/20/2016 0655   BUN 11 05/20/2016 0655   CREATININE 0.86 05/20/2016 0655   CALCIUM 8.9 05/20/2016 0655   PROT 6.3 (L) 05/19/2016 2215   ALBUMIN 3.3 (L) 05/19/2016 2215   AST 38 05/19/2016 2215   ALT 28 05/19/2016 2215   ALKPHOS 54 05/19/2016 2215   BILITOT 0.5 05/19/2016 2215   GFRNONAA >60 05/20/2016 0655   GFRAA >60 05/20/2016 0655    GFR Estimated Creatinine Clearance: 50.9 mL/min (by C-G formula based on SCr of 0.86 mg/dL). No results for input(s): LIPASE, AMYLASE in the last 72 hours.  Recent Labs  05/20/16 0308 05/20/16 0655 05/20/16 1315  TROPONINI <0.03 <0.03 <0.03   Invalid input(s): POCBNP No results for input(s): DDIMER in the last 72 hours.  Recent Labs  05/20/16 0655  HGBA1C 5.1    Recent Labs  05/20/16 0308  CHOL 144  HDL 51  LDLCALC 88  TRIG 27  CHOLHDL 2.8    Recent Labs  05/20/16 0655  TSH 1.561   No results for input(s): VITAMINB12, FOLATE, FERRITIN, TIBC, IRON, RETICCTPCT in the last 72 hours. Coags:  Recent Labs  05/19/16 2215  INR 1.01   Microbiology: No results  found for this or any previous visit (from the past 240 hour(s)).  FURTHER DISCHARGE INSTRUCTIONS:  Get Medicines reviewed and adjusted: Please take all your medications with you for your next visit with your Primary MD  Laboratory/radiological data: Please request your Primary MD to go over all hospital tests and procedure/radiological results at the follow up, please ask  your Primary MD to get all Hospital records sent to his/her office.  In some cases, they will be blood work, cultures and biopsy results pending at the time of your discharge. Please request that your primary care M.D. goes through all the records of your hospital data and follows up on these results.  Also Note the following: If you experience worsening of your admission symptoms, develop shortness of breath, life threatening emergency, suicidal or homicidal thoughts you must seek medical attention immediately by calling 911 or calling your MD immediately  if symptoms less severe.  You must read complete instructions/literature along with all the possible adverse reactions/side effects for all the Medicines you take and that have been prescribed to you. Take any new Medicines after you have completely understood and accpet all the possible adverse reactions/side effects.   Do not drive when taking Pain medications or sleeping medications (Benzodaizepines)  Do not take more than prescribed Pain, Sleep and Anxiety Medications. It is not advisable to combine anxiety,sleep and pain medications without talking with your primary care practitioner  Special Instructions: If you have smoked or chewed Tobacco  in the last 2 yrs please stop smoking, stop any regular Alcohol  and or any Recreational drug use.  Wear Seat belts while driving.  Please note: You were cared for by a hospitalist during your hospital stay. Once you are discharged, your primary care physician will handle any further medical issues. Please note that NO REFILLS for any discharge medications will be authorized once you are discharged, as it is imperative that you return to your primary care physician (or establish a relationship with a primary care physician if you do not have one) for your post hospital discharge needs so that they can reassess your need for medications and monitor your lab values.  Total Time spent coordinating  discharge including counseling, education and face to face time equals r 45 minutes.  SignedOren Binet 05/21/2016 12:55 PM

## 2016-06-21 ENCOUNTER — Encounter (HOSPITAL_COMMUNITY): Payer: Self-pay

## 2016-06-21 ENCOUNTER — Emergency Department (HOSPITAL_COMMUNITY)
Admission: EM | Admit: 2016-06-21 | Discharge: 2016-06-22 | Disposition: A | Discharge status: Home / Self Care | Payer: Medicare Other | Attending: Emergency Medicine | Admitting: Emergency Medicine

## 2016-06-21 DIAGNOSIS — Z87891 Personal history of nicotine dependence: Secondary | ICD-10-CM | POA: Insufficient documentation

## 2016-06-21 DIAGNOSIS — K9400 Colostomy complication, unspecified: Secondary | ICD-10-CM

## 2016-06-21 DIAGNOSIS — Z85038 Personal history of other malignant neoplasm of large intestine: Secondary | ICD-10-CM | POA: Insufficient documentation

## 2016-06-21 DIAGNOSIS — K9403 Colostomy malfunction: Secondary | ICD-10-CM | POA: Insufficient documentation

## 2016-06-21 LAB — CBC WITH DIFFERENTIAL/PLATELET
BASOS ABS: 0 10*3/uL (ref 0.0–0.1)
Basophils Relative: 1 %
Eosinophils Absolute: 0.2 10*3/uL (ref 0.0–0.7)
Eosinophils Relative: 2 %
HEMATOCRIT: 39 % (ref 39.0–52.0)
HEMOGLOBIN: 12.9 g/dL — AB (ref 13.0–17.0)
LYMPHS PCT: 13 %
Lymphs Abs: 1.2 10*3/uL (ref 0.7–4.0)
MCH: 29.6 pg (ref 26.0–34.0)
MCHC: 33.1 g/dL (ref 30.0–36.0)
MCV: 89.4 fL (ref 78.0–100.0)
MONO ABS: 1 10*3/uL (ref 0.1–1.0)
MONOS PCT: 12 %
NEUTROS ABS: 6.5 10*3/uL (ref 1.7–7.7)
Neutrophils Relative %: 72 %
Platelets: 236 10*3/uL (ref 150–400)
RBC: 4.36 MIL/uL (ref 4.22–5.81)
RDW: 15.2 % (ref 11.5–15.5)
WBC: 8.8 10*3/uL (ref 4.0–10.5)

## 2016-06-21 MED ORDER — IOPAMIDOL (ISOVUE-300) INJECTION 61%
INTRAVENOUS | Status: AC
  Administered 2016-06-21: 30 mL via ORAL
  Filled 2016-06-21: qty 30

## 2016-06-21 MED ORDER — IOPAMIDOL (ISOVUE-300) INJECTION 61%
30.0000 mL | Freq: Once | INTRAVENOUS | Status: AC | PRN
  Administered 2016-06-21: 30 mL via ORAL

## 2016-06-21 MED ORDER — SODIUM CHLORIDE 0.9 % IV SOLN
Freq: Once | INTRAVENOUS | Status: AC
  Administered 2016-06-21: via INTRAVENOUS

## 2016-06-21 NOTE — ED Provider Notes (Addendum)
Curtis Chavez Provider Note: Curtis Spurling, MD, FACEP  CSN: [id number] MRN: [id number] ARRIVAL: 06/21/16 at 2115 ROOM: WA01/WA01  By signing my name below, I, Curtis Chavez, attest that this documentation has been prepared under the direction and in the presence of Shanon Rosser, MD . Electronically Signed: Dolores Chavez, Scribe. 06/21/2016. 11:03 PM.  CHIEF COMPLAINT  Colostomy Leak   HISTORY OF PRESENT ILLNESS  Curtis Chavez is a 83 y.o. male with pmhx of colon cancer status post colostomy 5 years ago who presents to the Emergency Department complaining of a problem with his colostomy bag beginning yesterday. Pt's family member states that the pt has been leaking around his colostomy bag. She also states that a new opening has appeared just medial to the colostomy underlying the wafer holding the colostomy bag. She believes thick stool may have been leaking from this site.  Pt's family member denies any nausea, vomiting, diarrhea, fever, chills cough or SOB. Pt's colostomy was done in [place], 5 years ago. Pt's family member reports associated lower extremity pain and swelling which is an exacerbation of a chronic problem, perhaps exacerbated by physical therapy today. He took his usual diuretic without relief.   Past Medical History:  Diagnosis Date  . Colon cancer ([place]) 2013   has colostomy  . HOH (hard of hearing)   . UTI (urinary tract infection)     Past Surgical History:  Procedure Laterality Date  . COLON SURGERY     with colostomy  . HAND SURGERY Left    tendon repair    History reviewed. No pertinent family history.  Social History  Substance Use Topics  . Smoking status: Former Smoker    Types: Cigars  . Smokeless tobacco: Never Used     Comment: quit 30 years ago  . Alcohol use Yes     Comment: no for approx 20 yrs    Prior to Admission medications   Medication Sig Start Date End Date Taking? Authorizing Provider  bumetanide (BUMEX) 1 MG tablet  Take 1 mg by mouth daily.   Yes Historical Provider, MD    Allergies Patient has no known allergies.   REVIEW OF SYSTEMS  Negative except as noted here or in the History of Present Illness.   PHYSICAL EXAMINATION  Initial Vital Signs Blood pressure 162/72, pulse 79, temperature 97.5 F (36.4 C), temperature source Oral, resp. rate 18, height 5\' 6"  (1.676 m), weight 180 lb (81.6 kg), SpO2 94 %.  Examination General: Well-developed, well-nourished male in no acute distress; appearance consistent with age of record HENT: normocephalic; atraumatic Eyes: pupils equal, round and reactive to light; extraocular muscles intact, lens implants present from cataract surgery,  Neck: supple Heart: regular rate and rhythm;  Lungs: clear to auscultation bilaterally Abdomen: soft; nondistended; nontender; no masses or hepatosplenomegaly; bowel sounds present; colostomy LLQ; skin contact cavity corresponding to location of umbilicus medial to colostomy with dried stool possibly representing an umbilical fistula, no stool draining on exam Extremities: No deformity; full range of motion; 2+ pitting edema to lower legs Neurologic: Awake and alert; motor function intact in all extremities and symmetric; no facial droop Skin: Warm and dry Psychiatric: Flat affect   RESULTS  Summary of this visit's results, reviewed by myself:   EKG Interpretation  Date/Time:    Ventricular Rate:    PR Interval:    QRS Duration:   QT Interval:    QTC Calculation:   R Axis:     Text Interpretation:  Laboratory Studies: Results for orders placed or performed during the hospital encounter of 06/21/16 (from the past 24 hour(s))  CBC with Differential/Platelet     Status: Abnormal   Collection Time: 06/21/16 11:42 PM  Result Value Ref Range   WBC 8.8 4.0 - 10.5 K/uL   RBC 4.36 4.22 - 5.81 MIL/uL   Hemoglobin 12.9 (L) 13.0 - 17.0 g/dL   HCT 39.0 39.0 - 52.0 %   MCV 89.4 78.0 - 100.0 fL   MCH 29.6 26.0  - 34.0 pg   MCHC 33.1 30.0 - 36.0 g/dL   RDW 15.2 11.5 - 15.5 %   Platelets 236 150 - 400 K/uL   Neutrophils Relative % 72 %   Neutro Abs 6.5 1.7 - 7.7 K/uL   Lymphocytes Relative 13 %   Lymphs Abs 1.2 0.7 - 4.0 K/uL   Monocytes Relative 12 %   Monocytes Absolute 1.0 0.1 - 1.0 K/uL   Eosinophils Relative 2 %   Eosinophils Absolute 0.2 0.0 - 0.7 K/uL   Basophils Relative 1 %   Basophils Absolute 0.0 0.0 - 0.1 K/uL  Basic metabolic panel     Status: Abnormal   Collection Time: 06/21/16 11:42 PM  Result Value Ref Range   Sodium 135 135 - 145 mmol/L   Potassium 3.6 3.5 - 5.1 mmol/L   Chloride 101 101 - 111 mmol/L   CO2 26 22 - 32 mmol/L   Glucose, Bld 95 65 - 99 mg/dL   BUN 14 6 - 20 mg/dL   Creatinine, Ser 0.83 0.61 - 1.24 mg/dL   Calcium 8.7 (L) 8.9 - 10.3 mg/dL   GFR calc non Af Amer >60 >60 mL/min   GFR calc Af Amer >60 >60 mL/min   Anion gap 8 5 - 15   Imaging Studies: Ct Abdomen Pelvis W Contrast  Result Date: 06/22/2016 CLINICAL DATA:  83 year old with leaking around colostomy. New cutaneous opening medial to the ostomy bag. History of colon cancer. EXAM: CT ABDOMEN AND PELVIS WITH CONTRAST TECHNIQUE: Multidetector CT imaging of the abdomen and pelvis was performed using the standard protocol following bolus administration of intravenous contrast. CONTRAST:  155mL ISOVUE-300 IOPAMIDOL (ISOVUE-300) INJECTION 61% COMPARISON:  None. FINDINGS: Lower chest: Breathing motion artifact. Item probable right lower lobe bronchial thickening and atelectasis. No pleural effusion. Coronary artery calcifications are seen. Hepatobiliary: No focal hepatic lesion. Small calcified gallstone without abnormal gallbladder distention or inflammation. No biliary dilatation. Pancreas: No ductal dilatation or inflammation. Spleen: Normal in size without focal abnormality. Adrenals/Urinary Tract: Suspect bilateral adrenal thickening without dominant nodule, partially obscured by motion. There is no  hydronephrosis. Symmetric renal enhancement. Partially exophytic 1.5 cm lesion arising from the lower right kidney has Hounsfield units of 79 on venous phase imaging and 78 on delayed phase. Bilateral nonspecific perinephric edema. Urinary bladder is mildly distended with wall thickening. Stomach/Bowel: Stomach is physiologically distended. There is no bowel wall thickening, inflammation or obstruction. The appendix is normal. Focal calcification adjacent or within the cecum may be a calcified lymph node or enteric material. Transverse colostomy in the left upper quadrant. Small peristomal hernia. No associated colonic wall thickening. Stable left descending colon is seen in the left abdomen, with distal colon decompressed. There is no CT evidence of enterocutaneous fistula. Vascular/Lymphatic: Atherosclerosis of the abdominal aorta and its branches. No aneurysm. No adenopathy. Reproductive: Enlarged prostate gland measuring 5.6 x 5.1 x 5.1 cm causing mass effect on the bladder base. Other: No free air, ascites or intra-abdominal fluid  collection. No subcutaneous fluid collection. Tiny fat containing umbilical hernia without bowel involvement. Mild body wall edema. Musculoskeletal: Advanced osteoarthritis of the right hip. Lesser osteoarthritis of the left hip. There is fluid within the left iliopsoas bursa. Multilevel degenerative change throughout the spine with unilateral right L5 pars defect. Mild anterolisthesis of L5 on S1 and degenerative change. Diffuse bony under mineralization. There are no acute or suspicious osseous abnormalities. IMPRESSION: 1. Transverse colostomy with small parastomal hernia. No CT findings of enterocutaneous fistula, or other cutaneous fistula. 2. Gallstone without gallbladder inflammation. 3. Indeterminate hyperdense right renal lesion, possibly a hyperdense cyst, measuring 1.5 cm. Recommend correlation with prior imaging to evaluate for imaging stability. In the absence of prior  imaging, recommend nonemergent MRI characterization. If MRI is not feasible, pre and postcontrast renal protocol CT would be the next best option. 4. Incidental findings of enlarged prostate gland, abdominal atherosclerosis without aneurysm, bilateral hip osteoarthritis, right greater than left, and fluid in the left iliopsoas bursa. Electronically Signed   By: Jeb Levering M.D.   On: 06/22/2016 01:55    ED COURSE  Nursing notes and initial vitals signs, including pulse oximetry, reviewed.  Vitals:   06/21/16 2123 06/22/16 0023  BP: 162/72 166/81  Pulse: 79 87  Resp: 18 16  Temp: 97.5 F (36.4 C)   TempSrc: Oral   SpO2: 94% 99%  Weight: 180 lb (81.6 kg)   Height: 5\' 6"  (1.676 m)    2:20 AM Patient of family advised of CT findings. I do not believe this represents a fistula as there is no CT evidence of deep connection.  PROCEDURES    ED DIAGNOSES     ICD-9-CM ICD-10-CM   1. Complication of colostomy ([place]) 569.60 K94.00     I personally performed the services described in this documentation, which was scribed in my presence. The recorded information has been reviewed and is accurate.     Shanon Rosser, MD 06/22/16 [id number]    Shanon Rosser, MD 06/22/16 [id number]

## 2016-06-21 NOTE — ED Triage Notes (Signed)
Pt has a colostomy bag and he is leaking around it and there appears to be another hole that stool is coming through, pt has had colostomy for 5 years

## 2016-06-22 ENCOUNTER — Encounter (HOSPITAL_COMMUNITY): Payer: Self-pay

## 2016-06-22 ENCOUNTER — Emergency Department (HOSPITAL_COMMUNITY): Payer: Medicare Other

## 2016-06-22 DIAGNOSIS — K9403 Colostomy malfunction: Secondary | ICD-10-CM | POA: Diagnosis not present

## 2016-06-22 LAB — BASIC METABOLIC PANEL
Anion gap: 8 (ref 5–15)
BUN: 14 mg/dL (ref 6–20)
CHLORIDE: 101 mmol/L (ref 101–111)
CO2: 26 mmol/L (ref 22–32)
CREATININE: 0.83 mg/dL (ref 0.61–1.24)
Calcium: 8.7 mg/dL — ABNORMAL LOW (ref 8.9–10.3)
GFR calc Af Amer: >60 mL/min (ref >60)
GFR calc non Af Amer: >60 mL/min (ref >60)
GLUCOSE: 95 mg/dL (ref 65–99)
Potassium: 3.6 mmol/L (ref 3.5–5.1)
Sodium: 135 mmol/L (ref 135–145)

## 2016-06-22 MED ORDER — IOPAMIDOL (ISOVUE-300) INJECTION 61%
100.0000 mL | Freq: Once | INTRAVENOUS | Status: AC | PRN
  Administered 2016-06-22: 100 mL via INTRAVENOUS

## 2016-06-22 MED ORDER — IOPAMIDOL (ISOVUE-300) INJECTION 61%
INTRAVENOUS | Status: AC
  Filled 2016-06-22: qty 100

## 2017-03-02 IMAGING — CT CT ABD-PELV W/ CM
2 of 5 series · 15 of 46 positions shown, 17 images · IV contrast (ISOVUE)
Comparison: None.

CLINICAL DATA: [AGE] with leaking around colostomy. New
cutaneous opening medial to the ostomy bag. History of colon cancer.

EXAM:
CT ABDOMEN AND PELVIS WITH CONTRAST
TECHNIQUE: Multidetector CT imaging of the abdomen and pelvis was performed
using the standard protocol following bolus administration of
intravenous contrast.
CONTRAST:  100mL 7L9QX7-LLL IOPAMIDOL ([id number]-LLL) INJECTION 61%

[Series 2: abd/pel with · axial · 0.73mm/px · z∈[-524,-158]mm · 12 of 83 slices shown, 14 images]
[im 5/83  soft-tissue]
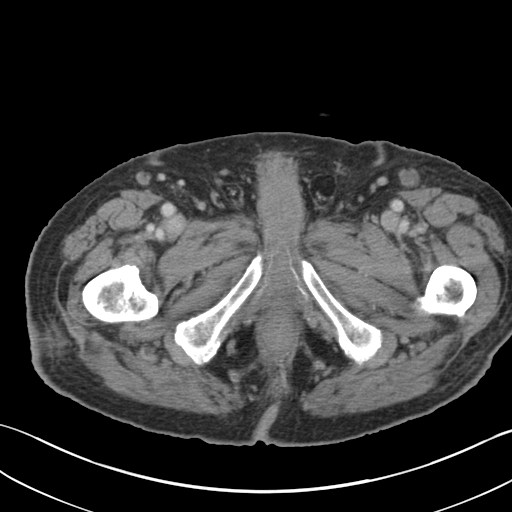
[im 5/83  bone]
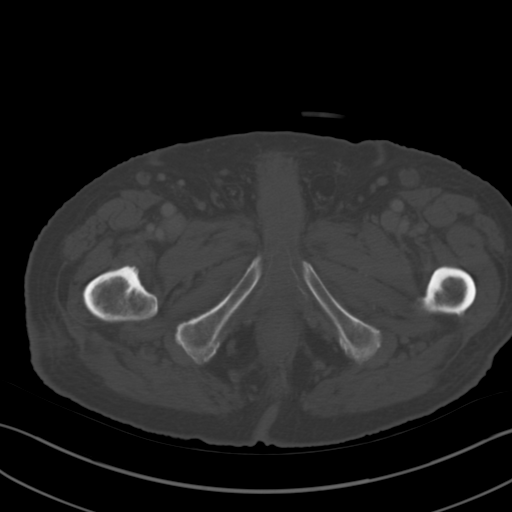
[im 14/83  soft-tissue]
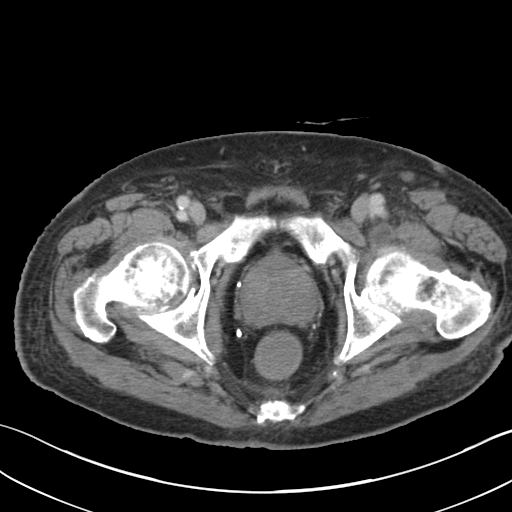
[im 19/83  soft-tissue]
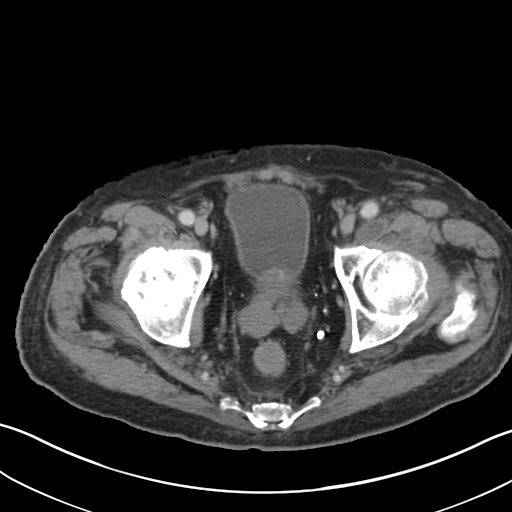
[im 23/83  soft-tissue]
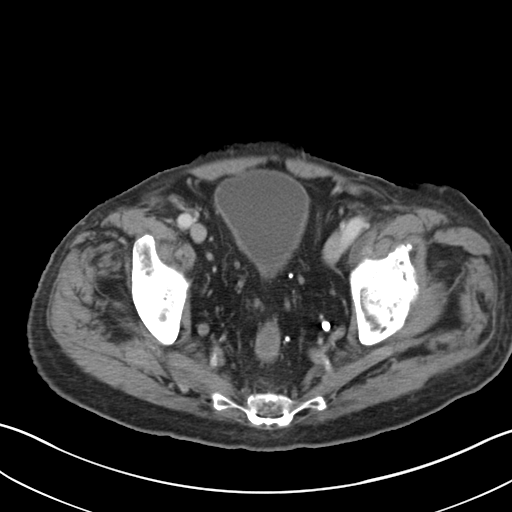
[im 32/83  soft-tissue]
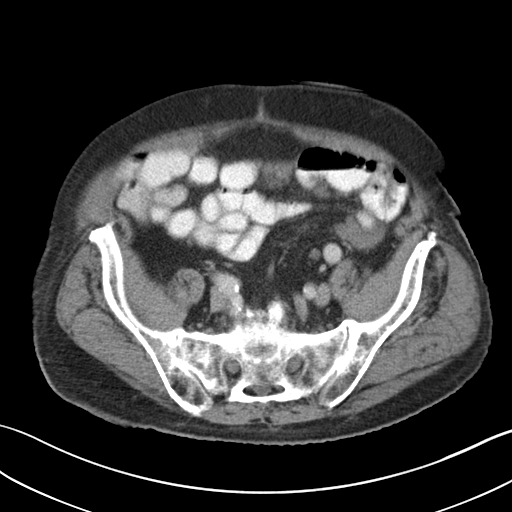
[im 37/83  soft-tissue]
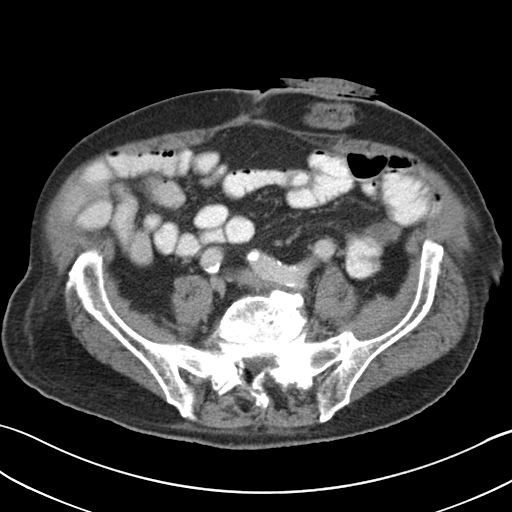
[im 46/83  soft-tissue]
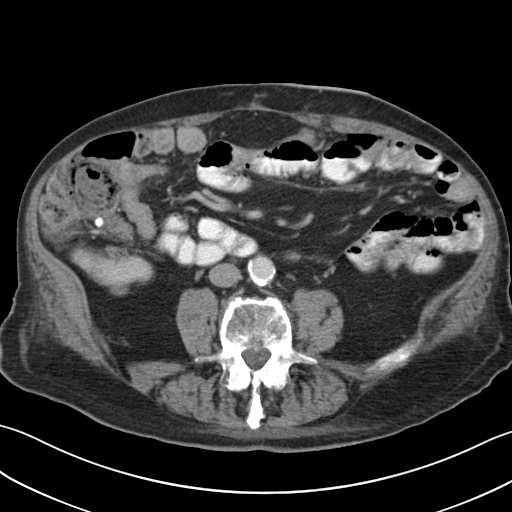
[im 51/83  soft-tissue]
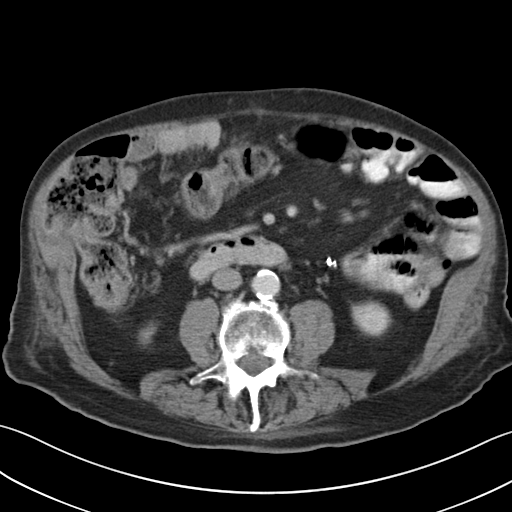
[im 60/83  soft-tissue]
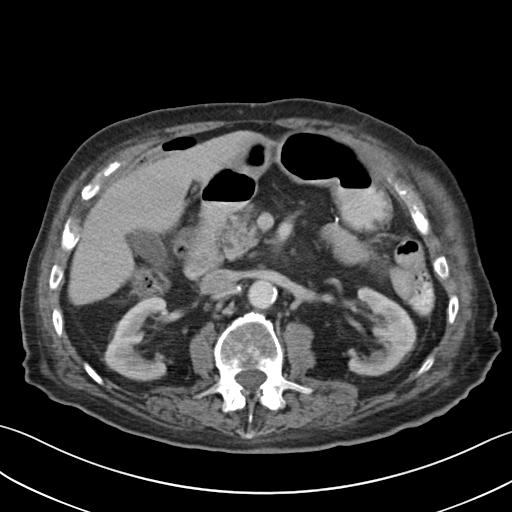
[im 60/83  bone]
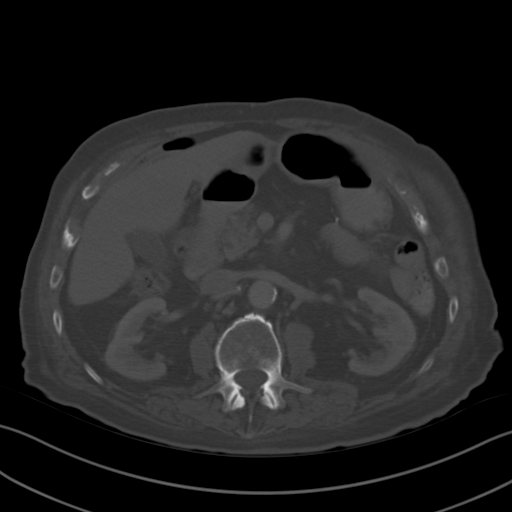
[im 64/83  soft-tissue]
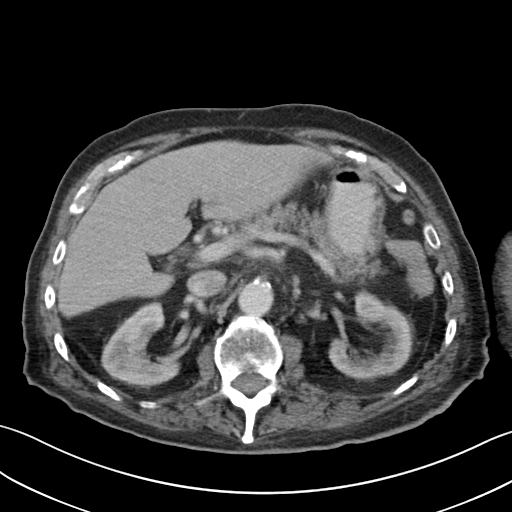
[im 69/83  soft-tissue]
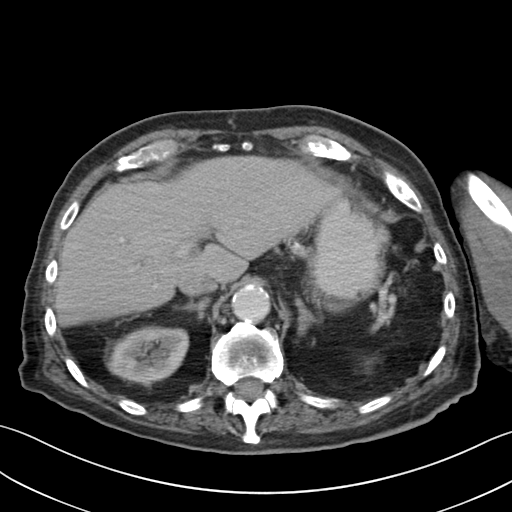
[im 78/83  soft-tissue]
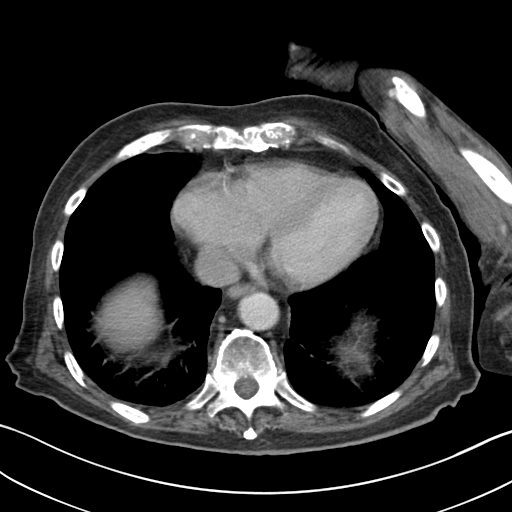

[Series 4: coronal a/|p · coronal · 0.67mm/px · 3 of 129 slices shown]
[im 43/129  soft-tissue]
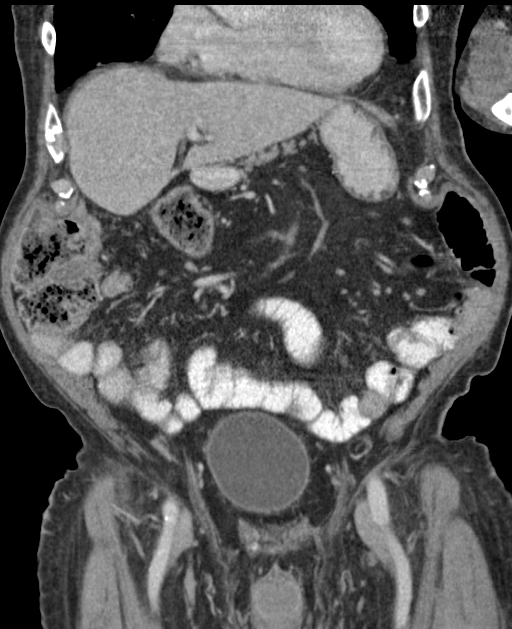
[im 57/129  soft-tissue]
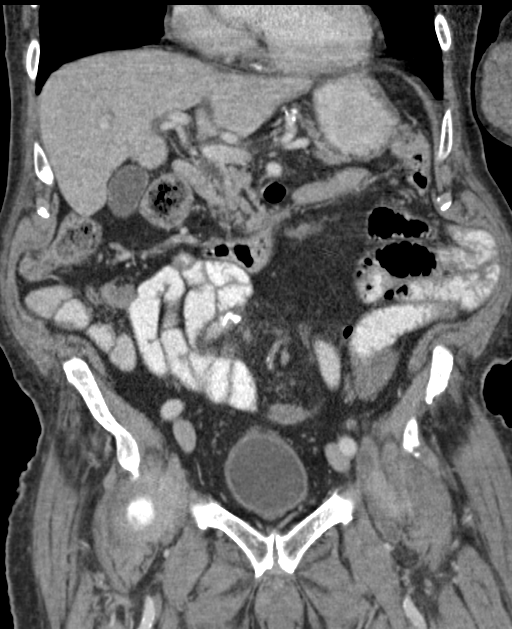
[im 72/129  soft-tissue]
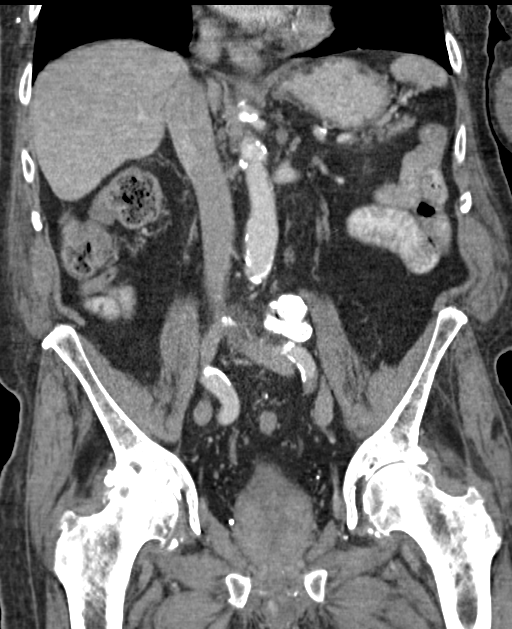

[15 of 46 positions shown; findings below may reference images not displayed]

FINDINGS: Lower chest: Breathing motion artifact. Item probable right lower
lobe bronchial thickening and atelectasis. No pleural effusion.
Coronary artery calcifications are seen.

Hepatobiliary: No focal hepatic lesion. Small calcified gallstone
without abnormal gallbladder distention or inflammation. No biliary
dilatation.

Pancreas: No ductal dilatation or inflammation.

Spleen: Normal in size without focal abnormality.

Adrenals/Urinary Tract: Suspect bilateral adrenal thickening without
dominant nodule, partially obscured by motion. There is no
hydronephrosis. Symmetric renal enhancement. Partially exophytic
cm lesion arising from the lower right kidney has Hounsfield units
of 79 on venous phase imaging and 78 on delayed phase. Bilateral
nonspecific perinephric edema. Urinary bladder is mildly distended
with wall thickening.

Stomach/Bowel: Stomach is physiologically distended. There is no
bowel wall thickening, inflammation or obstruction. The appendix is
normal. Focal calcification adjacent or within the cecum may be a
calcified lymph node or enteric material. Transverse colostomy in
the left upper quadrant. Small peristomal hernia. No associated
colonic wall thickening. Stable left descending colon is seen in the
left abdomen, with distal colon decompressed. There is no CT
evidence of enterocutaneous fistula.

Vascular/Lymphatic: Atherosclerosis of the abdominal aorta and its
branches. No aneurysm. No adenopathy.

Reproductive: Enlarged prostate gland measuring 5.6 x 5.1 x 5.1 cm
causing mass effect on the bladder base.

Other: No free air, ascites or intra-abdominal fluid collection. No
subcutaneous fluid collection. Tiny fat containing umbilical hernia
without bowel involvement. Mild body wall edema.

Musculoskeletal: Advanced osteoarthritis of the right hip. Lesser
osteoarthritis of the left hip. There is fluid within the left
iliopsoas bursa. Multilevel degenerative change throughout the spine
with unilateral right L5 pars defect. Mild anterolisthesis of L5 on
S1 and degenerative change. Diffuse bony under mineralization. There
are no acute or suspicious osseous abnormalities.
IMPRESSION: 1. Transverse colostomy with small parastomal hernia. No CT findings
of enterocutaneous fistula, or other cutaneous fistula.
2. Gallstone without gallbladder inflammation.
3. Indeterminate hyperdense right renal lesion, possibly a
hyperdense cyst, measuring 1.5 cm. Recommend correlation with prior
imaging to evaluate for imaging stability. In the absence of prior
imaging, recommend nonemergent MRI characterization. If MRI is not
feasible, pre and postcontrast renal protocol CT would be the next
best option.
4. Incidental findings of enlarged prostate gland, abdominal
atherosclerosis without aneurysm, bilateral hip osteoarthritis,
right greater than left, and fluid in the left iliopsoas bursa.

## 2019-12-09 ENCOUNTER — Emergency Department: Admit: 2019-12-10 | Payer: MEDICARE | Primary: Family Medicine

## 2019-12-09 DIAGNOSIS — A419 Sepsis, unspecified organism: Principal | ICD-10-CM

## 2019-12-10 ENCOUNTER — Inpatient Hospital Stay
Admit: 2019-12-10 | Discharge: 2019-12-14 | Disposition: A | Discharge status: Hospice | Payer: MEDICARE | Attending: Internal Medicine | Admitting: Internal Medicine

## 2019-12-10 LAB — GLUCOSE, POC: Glucose (POC): 130 mg/dL — ABNORMAL HIGH (ref 65–117)

## 2019-12-10 LAB — DRUG SCREEN, URINE
AMPHETAMINES: NEGATIVE
BARBITURATES: NEGATIVE
BENZODIAZEPINES: NEGATIVE
COCAINE: NEGATIVE
METHADONE: NEGATIVE
OPIATES: NEGATIVE
PCP(PHENCYCLIDINE): NEGATIVE
THC (TH-CANNABINOL): NEGATIVE

## 2019-12-10 LAB — CBC WITH AUTOMATED DIFF
ABS. BASOPHILS: 0.1 10*3/uL (ref 0.0–0.1)
ABS. EOSINOPHILS: 0 10*3/uL (ref 0.0–0.4)
ABS. IMM. GRANS.: 0.1 10*3/uL — ABNORMAL HIGH (ref 0.00–0.04)
ABS. LYMPHOCYTES: 0.8 10*3/uL (ref 0.8–3.5)
ABS. MONOCYTES: 0.9 10*3/uL (ref 0.0–1.0)
ABS. NEUTROPHILS: 8.9 10*3/uL — ABNORMAL HIGH (ref 1.8–8.0)
ABSOLUTE NRBC: 0 10*3/uL (ref 0.00–0.01)
BASOPHILS: 1 % (ref 0–1)
EOSINOPHILS: 0 % (ref 0–7)
HCT: 33.6 % — ABNORMAL LOW (ref 36.6–50.3)
HGB: 10.9 g/dL — ABNORMAL LOW (ref 12.1–17.0)
IMMATURE GRANULOCYTES: 1 % — ABNORMAL HIGH (ref 0.0–0.5)
LYMPHOCYTES: 7 % — ABNORMAL LOW (ref 12–49)
MCH: 30.3 PG (ref 26.0–34.0)
MCHC: 32.4 g/dL (ref 30.0–36.5)
MCV: 93.3 FL (ref 80.0–99.0)
MONOCYTES: 8 % (ref 5–13)
MPV: 9.7 FL (ref 8.9–12.9)
NEUTROPHILS: 83 % — ABNORMAL HIGH (ref 32–75)
NRBC: 0 PER 100 WBC
PLATELET: 312 10*3/uL (ref 150–400)
RBC: 3.6 M/uL — ABNORMAL LOW (ref 4.10–5.70)
RDW: 15.5 % — ABNORMAL HIGH (ref 11.5–14.5)
WBC: 10.8 10*3/uL (ref 4.1–11.1)

## 2019-12-10 LAB — TROPONIN I: Troponin-I, Qt.: 0.05 ng/mL — ABNORMAL HIGH (ref <0.05)

## 2019-12-10 LAB — URINALYSIS W/ REFLEX CULTURE
Bacteria: NEGATIVE /hpf
Glucose: NEGATIVE mg/dL
Hyaline cast: >20 /lpf — ABNORMAL HIGH (ref 0–5)
Leukocyte Esterase: NEGATIVE
Nitrites: NEGATIVE
Protein: 100 mg/dL — AB
Specific gravity: >1.03 — ABNORMAL HIGH (ref 1.003–1.030)
Urobilinogen: 1 EU/dL (ref 0.2–1.0)
pH (UA): 5.5 (ref 5.0–8.0)

## 2019-12-10 LAB — METABOLIC PANEL, COMPREHENSIVE
A-G Ratio: 0.6 — ABNORMAL LOW (ref 1.1–2.2)
ALT (SGPT): 35 U/L (ref 12–78)
AST (SGOT): 71 U/L — ABNORMAL HIGH (ref 15–37)
Albumin: 1.8 g/dL — ABNORMAL LOW (ref 3.5–5.0)
Alk. phosphatase: 39 U/L — ABNORMAL LOW (ref 45–117)
Anion gap: 8 mmol/L (ref 5–15)
BUN/Creatinine ratio: 25 — ABNORMAL HIGH (ref 12–20)
BUN: 26 MG/DL — ABNORMAL HIGH (ref 6–20)
Bilirubin, total: 0.6 MG/DL (ref 0.2–1.0)
CO2: 24 mmol/L (ref 21–32)
Calcium: 7.6 MG/DL — ABNORMAL LOW (ref 8.5–10.1)
Chloride: 108 mmol/L (ref 97–108)
Creatinine: 1.04 MG/DL (ref 0.70–1.30)
GFR est AA: >60 mL/min/{1.73_m2} (ref >60)
GFR est non-AA: >60 mL/min/{1.73_m2} (ref >60)
Globulin: 3.1 g/dL (ref 2.0–4.0)
Glucose: 110 mg/dL — ABNORMAL HIGH (ref 65–100)
Potassium: 3.8 mmol/L (ref 3.5–5.1)
Protein, total: 4.9 g/dL — ABNORMAL LOW (ref 6.4–8.2)
Sodium: 140 mmol/L (ref 136–145)

## 2019-12-10 LAB — RESPIRATORY VIRUS PANEL W/COVID-19, PCR
Adenovirus: NOT DETECTED
B. parapertussis, PCR: NOT DETECTED
Bordetella pertussis - PCR: NOT DETECTED
Chlamydophila pneumoniae DNA, QL, PCR: NOT DETECTED
Coronavirus 229E: NOT DETECTED
Coronavirus CVNL63: NOT DETECTED
Coronavirus HKU1: NOT DETECTED
Coronavirus OC43: NOT DETECTED
INFLUENZA A H1N1 PCR: NOT DETECTED
Influenza A, subtype H1: NOT DETECTED
Influenza A, subtype H3: NOT DETECTED
Influenza A: NOT DETECTED
Influenza B: NOT DETECTED
Metapneumovirus: NOT DETECTED
Mycoplasma pneumoniae DNA, QL, PCR: NOT DETECTED
Parainfluenza 1: NOT DETECTED
Parainfluenza 2: NOT DETECTED
Parainfluenza 3: NOT DETECTED
Parainfluenza virus 4: NOT DETECTED
RSV by PCR: NOT DETECTED
Rhinovirus and Enterovirus: NOT DETECTED
SARS-CoV-2, PCR: NOT DETECTED

## 2019-12-10 LAB — NT-PRO BNP: NT pro-BNP: 1521 PG/ML — ABNORMAL HIGH (ref <450)

## 2019-12-10 LAB — SAMPLES BEING HELD

## 2019-12-10 LAB — POC LACTIC ACID: Lactic Acid (POC): 1.74 mmol/L (ref 0.40–2.00)

## 2019-12-10 LAB — PROCALCITONIN: Procalcitonin: 0.2 ng/mL

## 2019-12-10 LAB — BILIRUBIN, CONFIRM: Bilirubin UA, confirm: NEGATIVE

## 2019-12-10 MED ORDER — LACTATED RINGERS IV
INTRAVENOUS | Status: DC
Start: 2019-12-10 — End: 2019-12-14
  Administered 2019-12-10 – 2019-12-12 (×6): via INTRAVENOUS

## 2019-12-10 MED ORDER — SODIUM CHLORIDE 0.9 % IJ SYRG
INTRAMUSCULAR | Status: DC | PRN
Start: 2019-12-10 — End: 2019-12-14
  Administered 2019-12-10 – 2019-12-13 (×5): via INTRAVENOUS

## 2019-12-10 MED ORDER — SODIUM CHLORIDE 0.9% BOLUS IV
0.9 % | INTRAVENOUS | Status: AC
Start: 2019-12-10 — End: 2019-12-10
  Administered 2019-12-10: 03:00:00 via INTRAVENOUS

## 2019-12-10 MED ORDER — POLYETHYLENE GLYCOL 3350 17 GRAM (100 %) ORAL POWDER PACKET
17 gram | Freq: Every day | ORAL | Status: DC | PRN
Start: 2019-12-10 — End: 2019-12-14

## 2019-12-10 MED ORDER — WATER FOR INJECTION, STERILE INJECTION
2 gram | Freq: Two times a day (BID) | INTRAMUSCULAR | Status: DC
Start: 2019-12-10 — End: 2019-12-14
  Administered 2019-12-10 – 2019-12-14 (×8): via INTRAVENOUS

## 2019-12-10 MED ORDER — IOPAMIDOL 76 % IV SOLN
370 mg iodine /mL (76 %) | Freq: Once | INTRAVENOUS | Status: AC
Start: 2019-12-10 — End: 2019-12-09
  Administered 2019-12-10: 03:00:00 via INTRAVENOUS

## 2019-12-10 MED ORDER — ACETAMINOPHEN 325 MG TABLET
325 mg | Freq: Four times a day (QID) | ORAL | Status: DC | PRN
Start: 2019-12-10 — End: 2019-12-14

## 2019-12-10 MED ORDER — ONDANSETRON 4 MG TAB, RAPID DISSOLVE
4 mg | Freq: Three times a day (TID) | ORAL | Status: DC | PRN
Start: 2019-12-10 — End: 2019-12-14

## 2019-12-10 MED ORDER — SODIUM CHLORIDE 0.9 % IV
1000 mg | INTRAVENOUS | Status: DC
Start: 2019-12-10 — End: 2019-12-11
  Administered 2019-12-11: 04:00:00 via INTRAVENOUS

## 2019-12-10 MED ORDER — ENOXAPARIN 40 MG/0.4 ML SUB-Q SYRINGE
40 mg/0.4 mL | Freq: Every day | SUBCUTANEOUS | Status: DC
Start: 2019-12-10 — End: 2019-12-14
  Administered 2019-12-10 – 2019-12-14 (×5): via SUBCUTANEOUS

## 2019-12-10 MED ORDER — VANCOMYCIN IN 0.9% SODIUM CHLORIDE 1.5 G/500 ML IV
1.5 g/500 mL | Freq: Once | INTRAVENOUS | Status: AC
Start: 2019-12-10 — End: 2019-12-10
  Administered 2019-12-10: 04:00:00 via INTRAVENOUS

## 2019-12-10 MED ORDER — ONDANSETRON (PF) 4 MG/2 ML INJECTION
4 mg/2 mL | Freq: Four times a day (QID) | INTRAMUSCULAR | Status: DC | PRN
Start: 2019-12-10 — End: 2019-12-14

## 2019-12-10 MED ORDER — ACETAMINOPHEN 650 MG RECTAL SUPPOSITORY
650 mg | Freq: Four times a day (QID) | RECTAL | Status: DC | PRN
Start: 2019-12-10 — End: 2019-12-14

## 2019-12-10 MED ORDER — D5-1/2 NS & POTASSIUM CHLORIDE 20 MEQ/L IV
20 mEq/L | INTRAVENOUS | Status: DC
Start: 2019-12-10 — End: 2019-12-10
  Administered 2019-12-10: 07:00:00 via INTRAVENOUS

## 2019-12-10 MED ORDER — PHARMACY VANCOMYCIN NOTE
Status: DC
Start: 2019-12-10 — End: 2019-12-10

## 2019-12-10 MED ORDER — WATER FOR INJECTION, STERILE INJECTION
2 gram | Freq: Once | INTRAMUSCULAR | Status: AC
Start: 2019-12-10 — End: 2019-12-09
  Administered 2019-12-10: 03:00:00 via INTRAVENOUS

## 2019-12-10 MED FILL — D5-1/2 NS & POTASSIUM CHLORIDE 20 MEQ/L IV: 20 mEq/L | INTRAVENOUS | Qty: 1000

## 2019-12-10 MED FILL — BD POSIFLUSH NORMAL SALINE 0.9 % INJECTION SYRINGE: INTRAMUSCULAR | Qty: 10

## 2019-12-10 MED FILL — SODIUM CHLORIDE 0.9 % IV: INTRAVENOUS | Qty: 1000

## 2019-12-10 MED FILL — ISOVUE-370  76 % INTRAVENOUS SOLUTION: 370 mg iodine /mL (76 %) | INTRAVENOUS | Qty: 200

## 2019-12-10 MED FILL — CEFEPIME 2 GRAM SOLUTION FOR INJECTION: 2 gram | INTRAMUSCULAR | Qty: 2

## 2019-12-10 MED FILL — LACTATED RINGERS IV: INTRAVENOUS | Qty: 1000

## 2019-12-10 MED FILL — VANCOMYCIN 10 GRAM IV SOLR: 10 gram | INTRAVENOUS | Qty: 1500

## 2019-12-10 MED FILL — PHARMACY VANCOMYCIN NOTE: Qty: 1

## 2019-12-10 MED FILL — ENOXAPARIN 40 MG/0.4 ML SUB-Q SYRINGE: 40 mg/0.4 mL | SUBCUTANEOUS | Qty: 0.4

## 2019-12-11 LAB — CBC W/O DIFF
ABSOLUTE NRBC: 0 10*3/uL (ref 0.00–0.01)
HCT: 34.1 % — ABNORMAL LOW (ref 36.6–50.3)
HGB: 10.6 g/dL — ABNORMAL LOW (ref 12.1–17.0)
MCH: 29.9 PG (ref 26.0–34.0)
MCHC: 31.1 g/dL (ref 30.0–36.5)
MCV: 96.1 FL (ref 80.0–99.0)
MPV: 9.8 FL (ref 8.9–12.9)
NRBC: 0 PER 100 WBC
PLATELET: 299 10*3/uL (ref 150–400)
RBC: 3.55 M/uL — ABNORMAL LOW (ref 4.10–5.70)
RDW: 15.5 % — ABNORMAL HIGH (ref 11.5–14.5)
WBC: 9.7 10*3/uL (ref 4.1–11.1)

## 2019-12-11 LAB — URINALYSIS W/ REFLEX CULTURE
Bacteria: NEGATIVE /hpf
Bilirubin: NEGATIVE
Glucose: NEGATIVE mg/dL
Ketone: NEGATIVE mg/dL
Leukocyte Esterase: NEGATIVE
Nitrites: NEGATIVE
Protein: 30 mg/dL — AB
Specific gravity: 1.015 (ref 1.003–1.030)
Urobilinogen: 0.2 EU/dL (ref 0.2–1.0)
pH (UA): 5.5 (ref 5.0–8.0)

## 2019-12-11 LAB — S.PNEUMO AG, UR/CSF: Streptococcus pneumoniae Ag: NEGATIVE

## 2019-12-11 LAB — METABOLIC PANEL, COMPREHENSIVE
A-G Ratio: 0.6 — ABNORMAL LOW (ref 1.1–2.2)
ALT (SGPT): 32 U/L (ref 12–78)
AST (SGOT): 45 U/L — ABNORMAL HIGH (ref 15–37)
Albumin: 2 g/dL — ABNORMAL LOW (ref 3.5–5.0)
Alk. phosphatase: 38 U/L — ABNORMAL LOW (ref 45–117)
Anion gap: 4 mmol/L — ABNORMAL LOW (ref 5–15)
BUN/Creatinine ratio: 36 — ABNORMAL HIGH (ref 12–20)
BUN: 24 MG/DL — ABNORMAL HIGH (ref 6–20)
Bilirubin, total: 0.5 MG/DL (ref 0.2–1.0)
CO2: 27 mmol/L (ref 21–32)
Calcium: 7.8 MG/DL — ABNORMAL LOW (ref 8.5–10.1)
Chloride: 113 mmol/L — ABNORMAL HIGH (ref 97–108)
Creatinine: 0.66 MG/DL — ABNORMAL LOW (ref 0.70–1.30)
GFR est AA: >60 mL/min/{1.73_m2} (ref >60)
GFR est non-AA: >60 mL/min/{1.73_m2} (ref >60)
Globulin: 3.1 g/dL (ref 2.0–4.0)
Glucose: 83 mg/dL (ref 65–100)
Potassium: 3.8 mmol/L (ref 3.5–5.1)
Protein, total: 5.1 g/dL — ABNORMAL LOW (ref 6.4–8.2)
Sodium: 144 mmol/L (ref 136–145)

## 2019-12-11 LAB — LEGIONELLA PNEUMOPHILA AG, URINE: L pneumophila S1 Ag, urine: NEGATIVE

## 2019-12-11 LAB — EKG, 12 LEAD, INITIAL
Atrial Rate: 95 {beats}/min
Calculated P Axis: 46 degrees
Calculated R Axis: 55 degrees
Calculated T Axis: 52 degrees
P-R Interval: 188 ms
Q-T Interval: 358 ms
QRS Duration: 74 ms
QTC Calculation (Bezet): 449 ms
Ventricular Rate: 95 {beats}/min

## 2019-12-11 LAB — CULTURE, URINE: Culture result:: NO GROWTH

## 2019-12-11 LAB — MAGNESIUM: Magnesium: 1.9 mg/dL (ref 1.6–2.4)

## 2019-12-11 MED ORDER — PHARMACY VANCOMYCIN NOTE
Freq: Once | Status: AC
Start: 2019-12-11 — End: 2019-12-11
  Administered 2019-12-12: 02:00:00

## 2019-12-11 MED FILL — CEFEPIME 2 GRAM SOLUTION FOR INJECTION: 2 gram | INTRAMUSCULAR | Qty: 2

## 2019-12-11 MED FILL — PHARMACY VANCOMYCIN NOTE: Qty: 1

## 2019-12-11 MED FILL — ENOXAPARIN 40 MG/0.4 ML SUB-Q SYRINGE: 40 mg/0.4 mL | SUBCUTANEOUS | Qty: 0.4

## 2019-12-11 MED FILL — VANCOMYCIN 1,000 MG IV SOLR: 1000 mg | INTRAVENOUS | Qty: 1000

## 2019-12-12 LAB — VANCOMYCIN, TROUGH: Vancomycin,trough: 10 ug/mL (ref 5.0–10.0)

## 2019-12-12 LAB — CULTURE, URINE: Culture result:: NO GROWTH

## 2019-12-12 MED ORDER — SODIUM CHLORIDE 0.9 % IV
1.25 gram | Freq: Once | INTRAVENOUS | Status: AC
Start: 2019-12-12 — End: 2019-12-12
  Administered 2019-12-12: 03:00:00 via INTRAVENOUS

## 2019-12-12 MED ORDER — VANCOMYCIN 750 MG IV SOLUTION
750 mg | Freq: Two times a day (BID) | INTRAVENOUS | Status: DC
Start: 2019-12-12 — End: 2019-12-14
  Administered 2019-12-12 – 2019-12-14 (×4): via INTRAVENOUS

## 2019-12-12 MED ORDER — PHARMACY VANCOMYCIN NOTE
Freq: Once | Status: AC
Start: 2019-12-12 — End: 2019-12-13
  Administered 2019-12-13: 19:00:00

## 2019-12-12 MED FILL — ENOXAPARIN 40 MG/0.4 ML SUB-Q SYRINGE: 40 mg/0.4 mL | SUBCUTANEOUS | Qty: 0.4

## 2019-12-12 MED FILL — CEFEPIME 2 GRAM SOLUTION FOR INJECTION: 2 gram | INTRAMUSCULAR | Qty: 2

## 2019-12-12 MED FILL — VANCOMYCIN 1.25 GRAM IV SOLUTION: 1.25 gram | INTRAVENOUS | Qty: 1250

## 2019-12-12 MED FILL — PHARMACY VANCOMYCIN NOTE: Qty: 1

## 2019-12-12 MED FILL — VANCOMYCIN 1,000 MG IV SOLR: 1000 mg | INTRAVENOUS | Qty: 1000

## 2019-12-12 MED FILL — VANCOMYCIN 750 MG IV SOLUTION: 750 mg | INTRAVENOUS | Qty: 750

## 2019-12-13 LAB — VANCOMYCIN, TROUGH: Vancomycin,trough: 14 ug/mL — ABNORMAL HIGH (ref 5.0–10.0)

## 2019-12-13 LAB — METABOLIC PANEL, BASIC
Anion gap: 3 mmol/L — ABNORMAL LOW (ref 5–15)
BUN/Creatinine ratio: 39 — ABNORMAL HIGH (ref 12–20)
BUN: 16 MG/DL (ref 6–20)
CO2: 25 mmol/L (ref 21–32)
Calcium: 7.8 MG/DL — ABNORMAL LOW (ref 8.5–10.1)
Chloride: 115 mmol/L — ABNORMAL HIGH (ref 97–108)
Creatinine: 0.41 MG/DL — ABNORMAL LOW (ref 0.70–1.30)
GFR est AA: >60 mL/min/{1.73_m2} (ref >60)
GFR est non-AA: >60 mL/min/{1.73_m2} (ref >60)
Glucose: 86 mg/dL (ref 65–100)
Potassium: 4 mmol/L (ref 3.5–5.1)
Sodium: 143 mmol/L (ref 136–145)

## 2019-12-13 LAB — CULTURE, MRSA

## 2019-12-13 MED FILL — VANCOMYCIN 750 MG IV SOLUTION: 750 mg | INTRAVENOUS | Qty: 750

## 2019-12-13 MED FILL — CEFEPIME 2 GRAM SOLUTION FOR INJECTION: 2 gram | INTRAMUSCULAR | Qty: 2

## 2019-12-13 MED FILL — ENOXAPARIN 40 MG/0.4 ML SUB-Q SYRINGE: 40 mg/0.4 mL | SUBCUTANEOUS | Qty: 0.4

## 2019-12-14 ENCOUNTER — Encounter: Primary: Family Medicine

## 2019-12-14 ENCOUNTER — Encounter: Admit: 2019-12-14 | Discharge: 2019-12-14 | Payer: MEDICARE | Primary: Family Medicine

## 2019-12-14 MED ORDER — LEVOFLOXACIN 750 MG TAB
750 mg | ORAL | Status: DC
Start: 2019-12-14 — End: 2019-12-14
  Administered 2019-12-14: 13:00:00 via ORAL

## 2019-12-14 MED ORDER — LEVOFLOXACIN 750 MG TAB
750 mg | ORAL_TABLET | ORAL | 0 refills | Status: AC
Start: 2019-12-14 — End: 2019-12-18

## 2019-12-14 MED FILL — ENOXAPARIN 40 MG/0.4 ML SUB-Q SYRINGE: 40 mg/0.4 mL | SUBCUTANEOUS | Qty: 0.4

## 2019-12-14 MED FILL — LEVOFLOXACIN 750 MG TAB: 750 mg | ORAL | Qty: 1

## 2019-12-14 MED FILL — CEFEPIME 2 GRAM SOLUTION FOR INJECTION: 2 gram | INTRAMUSCULAR | Qty: 2

## 2019-12-14 MED FILL — VANCOMYCIN 750 MG IV SOLUTION: 750 mg | INTRAVENOUS | Qty: 750

## 2019-12-15 ENCOUNTER — Encounter: Primary: Family Medicine

## 2019-12-15 ENCOUNTER — Encounter: Admit: 2019-12-15 | Discharge: 2019-12-15 | Payer: MEDICARE | Primary: Family Medicine

## 2019-12-15 LAB — CULTURE, BLOOD
Culture result:: NO GROWTH
Culture result:: NO GROWTH

## 2019-12-17 ENCOUNTER — Encounter: Primary: Family Medicine

## 2019-12-17 ENCOUNTER — Encounter: Admit: 2019-12-17 | Discharge: 2019-12-17 | Payer: MEDICARE | Primary: Family Medicine

## 2019-12-18 ENCOUNTER — Encounter: Primary: Family Medicine

## 2019-12-18 ENCOUNTER — Encounter: Admit: 2019-12-18 | Discharge: 2019-12-18 | Payer: MEDICARE | Primary: Family Medicine

## 2019-12-19 ENCOUNTER — Encounter: Primary: Family Medicine

## 2019-12-20 ENCOUNTER — Encounter: Admit: 2019-12-20 | Discharge: 2019-12-20 | Payer: MEDICARE | Primary: Family Medicine

## 2019-12-21 ENCOUNTER — Encounter: Primary: Family Medicine

## 2019-12-21 ENCOUNTER — Encounter: Admit: 2019-12-21 | Discharge: 2019-12-21 | Payer: MEDICARE | Primary: Family Medicine

## 2019-12-24 ENCOUNTER — Encounter: Primary: Family Medicine

## 2019-12-25 ENCOUNTER — Encounter: Primary: Family Medicine

## 2019-12-26 ENCOUNTER — Encounter: Primary: Family Medicine

## 2019-12-27 ENCOUNTER — Encounter: Primary: Family Medicine

## 2019-12-28 ENCOUNTER — Encounter: Primary: Family Medicine

## 2019-12-31 ENCOUNTER — Encounter: Primary: Family Medicine

## 2020-01-01 ENCOUNTER — Encounter: Primary: Family Medicine

## 2020-01-02 ENCOUNTER — Encounter: Primary: Family Medicine

## 2020-01-03 ENCOUNTER — Encounter: Primary: Family Medicine

## 2020-01-04 ENCOUNTER — Encounter: Primary: Family Medicine

## 2020-01-06 DEATH — deceased

## 2020-01-07 ENCOUNTER — Encounter: Primary: Family Medicine

## 2020-01-08 ENCOUNTER — Encounter: Primary: Family Medicine

## 2020-01-09 ENCOUNTER — Encounter: Primary: Family Medicine

## 2020-01-10 ENCOUNTER — Encounter: Primary: Family Medicine

## 2020-01-11 ENCOUNTER — Encounter: Primary: Family Medicine

## 2020-01-14 ENCOUNTER — Encounter: Primary: Family Medicine

## 2020-01-15 ENCOUNTER — Encounter: Primary: Family Medicine

## 2020-01-16 ENCOUNTER — Encounter: Primary: Family Medicine

## 2020-01-17 ENCOUNTER — Encounter: Primary: Family Medicine

## 2020-01-18 ENCOUNTER — Encounter: Primary: Family Medicine

## 2020-01-21 ENCOUNTER — Encounter: Primary: Family Medicine

## 2020-01-22 ENCOUNTER — Encounter: Primary: Family Medicine

## 2020-01-23 ENCOUNTER — Encounter: Primary: Family Medicine

## 2020-01-24 ENCOUNTER — Encounter: Primary: Family Medicine

## 2020-01-25 ENCOUNTER — Encounter: Primary: Family Medicine

## 2020-01-28 ENCOUNTER — Encounter: Primary: Family Medicine

## 2020-01-29 ENCOUNTER — Encounter: Primary: Family Medicine

## 2020-01-30 ENCOUNTER — Encounter: Primary: Family Medicine

## 2020-01-31 ENCOUNTER — Encounter: Primary: Family Medicine

## 2020-02-01 ENCOUNTER — Encounter: Primary: Family Medicine

## 2020-02-04 ENCOUNTER — Encounter: Primary: Family Medicine

## 2020-02-05 ENCOUNTER — Encounter: Primary: Family Medicine

## 2020-02-06 ENCOUNTER — Encounter: Primary: Family Medicine

## 2020-02-07 ENCOUNTER — Encounter: Primary: Family Medicine

## 2020-02-08 ENCOUNTER — Encounter: Primary: Family Medicine

## 2020-02-11 ENCOUNTER — Encounter: Primary: Family Medicine

## 2020-02-12 ENCOUNTER — Encounter: Primary: Family Medicine

## 2020-02-13 ENCOUNTER — Encounter: Primary: Family Medicine

## 2020-02-14 ENCOUNTER — Encounter: Primary: Family Medicine

## 2020-02-15 ENCOUNTER — Encounter: Primary: Family Medicine

## 2020-02-18 ENCOUNTER — Encounter: Primary: Family Medicine

## 2020-02-19 ENCOUNTER — Encounter: Primary: Family Medicine

## 2020-02-20 ENCOUNTER — Encounter: Primary: Family Medicine

## 2020-02-21 ENCOUNTER — Encounter: Primary: Family Medicine

## 2020-02-22 ENCOUNTER — Encounter: Primary: Family Medicine

## 2020-02-25 ENCOUNTER — Encounter: Primary: Family Medicine

## 2020-02-26 ENCOUNTER — Encounter: Primary: Family Medicine

## 2020-02-27 ENCOUNTER — Encounter: Primary: Family Medicine

## 2020-02-28 ENCOUNTER — Encounter: Primary: Family Medicine

## 2020-02-29 ENCOUNTER — Encounter: Primary: Family Medicine

## 2020-03-03 ENCOUNTER — Encounter: Primary: Family Medicine

## 2020-03-04 ENCOUNTER — Encounter: Primary: Family Medicine

## 2020-03-05 ENCOUNTER — Encounter: Primary: Family Medicine

## 2020-03-06 ENCOUNTER — Encounter: Primary: Family Medicine

## 2020-03-07 ENCOUNTER — Encounter: Primary: Family Medicine
